# Patient Record
Sex: Female | Born: 1950
Health system: Southern US, Academic
[De-identification: ages and names within clinical notes are randomized; demographics above are authoritative.]

---

## 2001-12-05 ENCOUNTER — Ambulatory Visit (HOSPITAL_COMMUNITY): Payer: Self-pay

## 2014-05-06 ENCOUNTER — Ambulatory Visit (INDEPENDENT_AMBULATORY_CARE_PROVIDER_SITE_OTHER): Payer: Self-pay | Admitting: Neurological Surgery

## 2016-08-06 ENCOUNTER — Ambulatory Visit (HOSPITAL_BASED_OUTPATIENT_CLINIC_OR_DEPARTMENT_OTHER): Payer: Self-pay

## 2016-08-06 DIAGNOSIS — Z006 Encounter for examination for normal comparison and control in clinical research program: Secondary | ICD-10-CM

## 2016-08-18 NOTE — Progress Notes (Signed)
[name] showed for Finding WEllness. Laurie LunaGwen Sarai January, MD

## 2020-01-24 IMAGING — MR MRI LSPINE WO CONTRAST
5 of 6 series · 25 of 48 positions shown · non-contrast
Comparison: None

HISTORY: Low back pain down both legs, hx of surgery, no known injury
TECHNIQUE: Routine multiplanar MRI of the lumbar spine was performed without IV contrast.

[Series 16: t2_sag · sagittal · 4.0mm · 0.73mm/px · 4 of 18 slices shown]
[im 1/18]
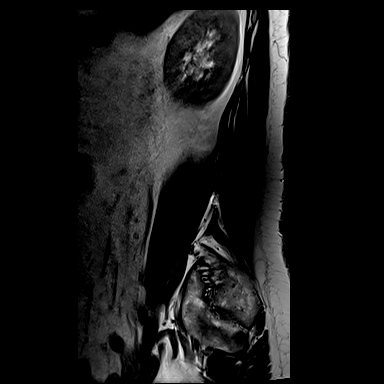
[im 6/18]
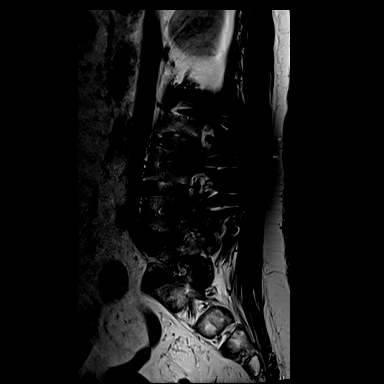
[im 12/18]
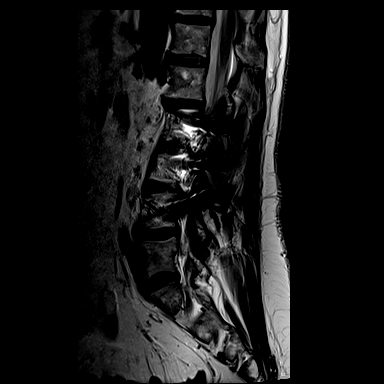
[im 18/18]
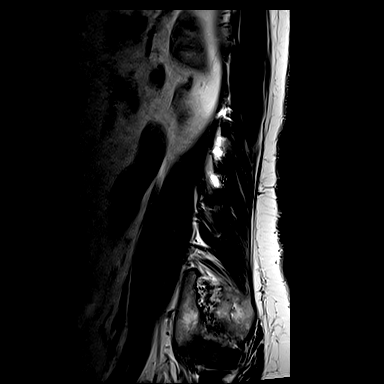

[Series 17: t1_sag · sagittal · 4.0mm · 0.88mm/px · 4 of 19 slices shown]
[im 1/19]
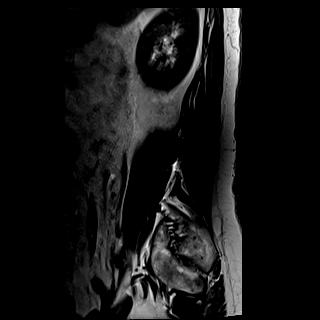
[im 7/19]
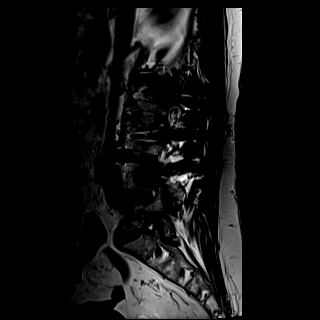
[im 13/19]
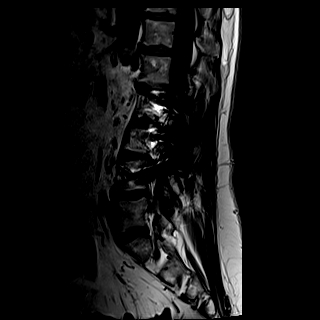
[im 19/19]
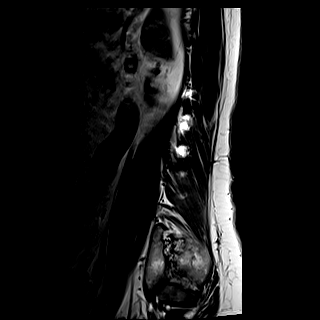

[Series 18: stir_sag · sagittal · 4.0mm · 1.09mm/px · 4 of 19 slices shown]
[im 1/19]
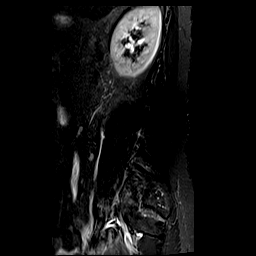
[im 7/19]
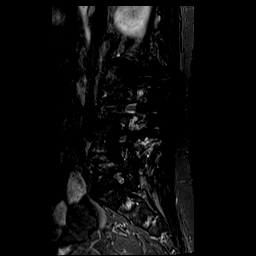
[im 13/19]
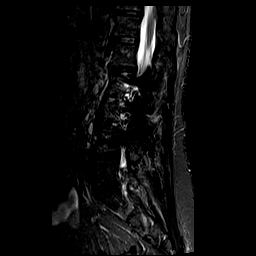
[im 19/19]
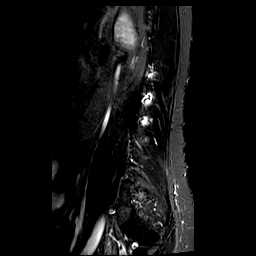

[Series 19: t2_axial · axial · 4.0mm · 0.52mm/px · z∈[-482,-285]mm · 8 of 41 slices shown]
[im 1/41]
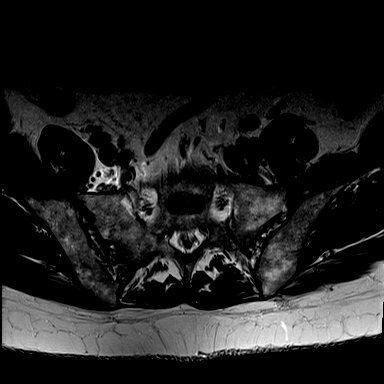
[im 6/41]
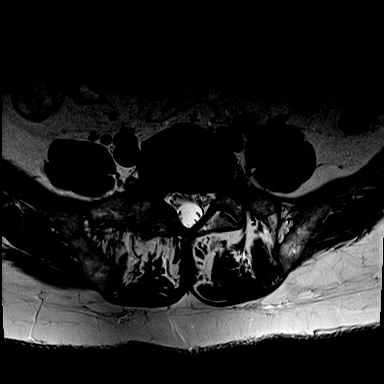
[im 12/41]
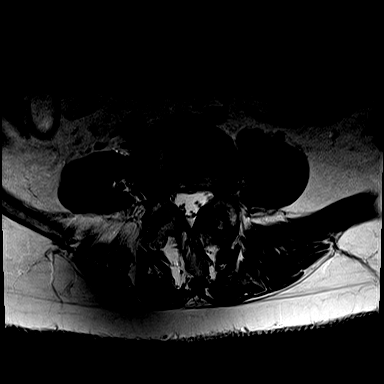
[im 18/41]
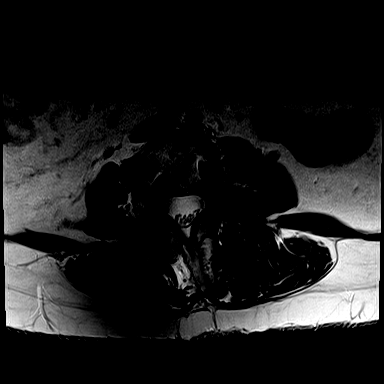
[im 23/41]
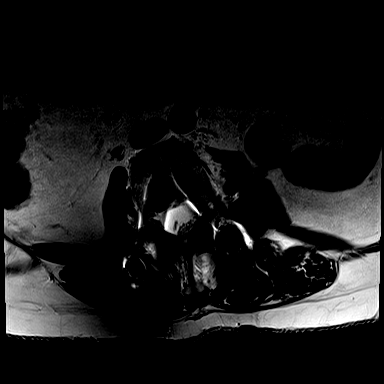
[im 29/41]
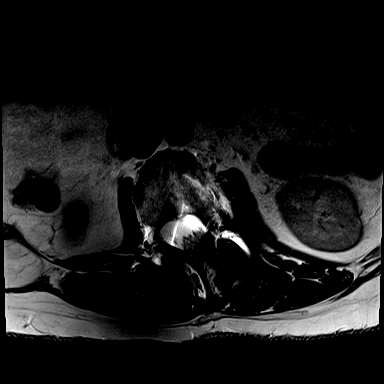
[im 35/41]
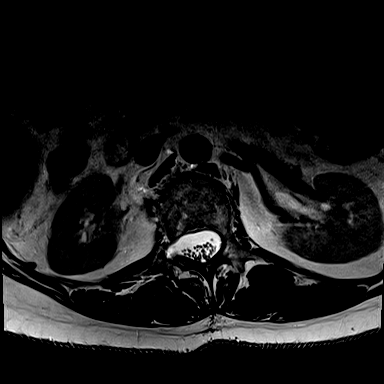
[im 41/41]
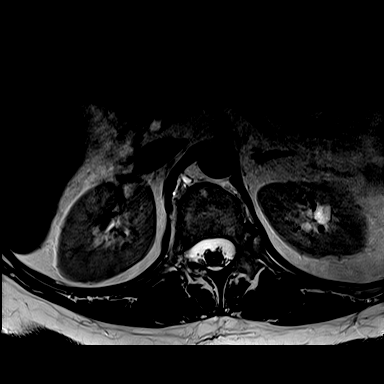

[Series 20: t1_axial_obli · axial · 3.0mm · 0.78mm/px · z∈[-492,-301]mm · 5 of 26 slices shown]
[im 1/26]
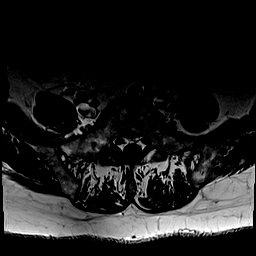
[im 7/26]
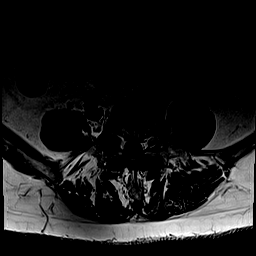
[im 13/26]
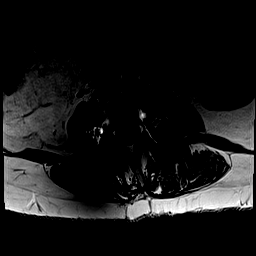
[im 19/26]
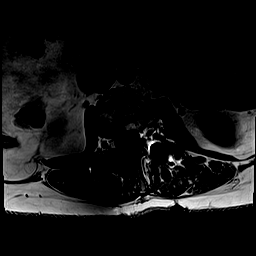
[im 26/26]
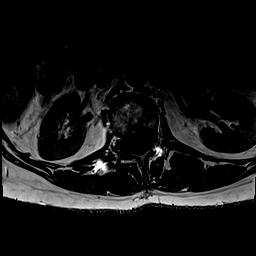

[25 of 48 positions shown; findings below may reference images not displayed]

FINDINGS: Lumbar segments demonstrate mild lumbar dextroscoliosis. No anterolisthesis or retrolisthesis.

Patient status post discectomy and fusion at L2-3 and L3-4. Orthopedic hardware produces significant surrounding susceptibility artifact.

Mild diffuse disc bulging at remaining disc space levels. No abnormal signal in an intradural or paraspinous position. No postop fluid collections are visible.

T12-L1, L1-2: Mild diffuse disc bulging. Mild facet arthropathy. No significant degree of canal or foraminal stenosis.

L2-3: Postop discectomy and fusion. No canal stenosis. Bilateral facet arthropathy. No convincing evidence of significant foraminal stenosis. Foramina are obscured by susceptibility artifact.

L3-4: Postop discectomy and fusion with pedicle screws and connecting rods. There is no canal stenosis. No evidence of significant foraminal stenosis. Foramina are partially obscured by susceptibility artifact.

L4-5: Moderate diffuse disc bulge, more prominent to the right of midline. Moderate facet arthropathy. Mild canal stenosis. Moderate right foraminal stenosis. Mild left foraminal stenosis.

L5-S1: Mild diffuse disc bulge. Mild facet arthropathy, more prominent on the right. No canal stenosis. Moderate right foraminal stenosis. Mild left foraminal stenosis.
IMPRESSION: Lumbar dextroscoliosis with associated multilevel degenerative disc disease and facet arthropathy most severe at L4-5 where there is a mild degree of canal stenosis and moderate right foraminal stenosis. Moderate right foraminal stenosis also present L5-S1. Postop discectomy and fusion from L2 to L4. No stenoses at postop levels. No postop fluid collections.

## 2020-02-23 IMAGING — MR MRI KNEE RT WO CONTRAST
4 of 5 series · 30 of 40 positions shown · IV contrast (gadolinium)
Comparison: None

HISTORY: Pain in right knee
TECHNIQUE: Multiplanar and multisequence MR imaging of the right knee was performed without the administration of intravenous gadolinium.

[Series 8: t2_axial_fs · axial · right · 3.0mm · 0.45mm/px · z∈[-53,+61]mm · 8 of 30 slices shown]
[im 1/30]
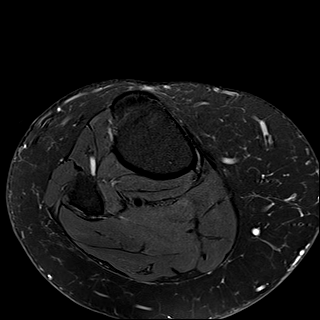
[im 5/30]
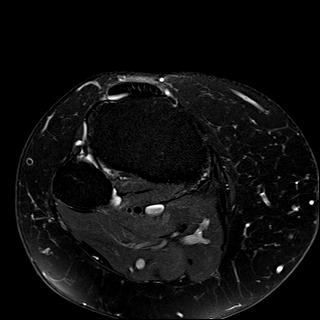
[im 9/30]
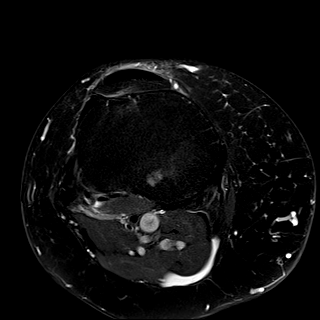
[im 13/30]
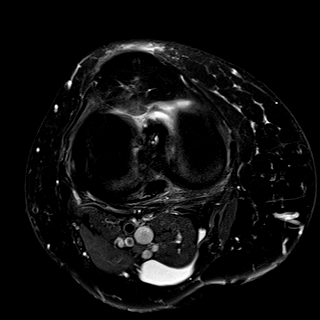
[im 17/30]
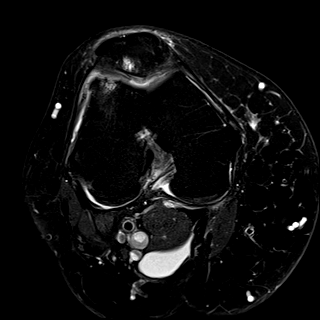
[im 21/30]
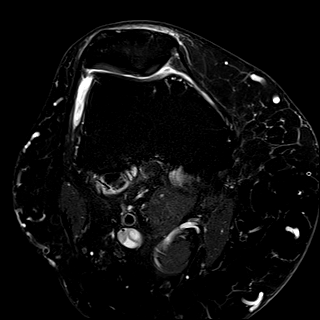
[im 25/30]
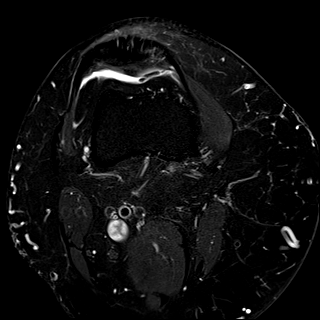
[im 30/30]
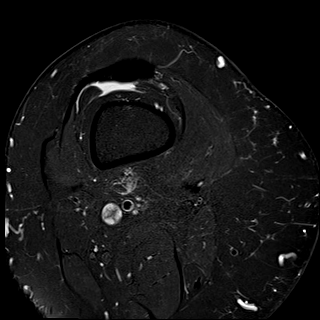

[Series 10: t1_cor · coronal · right · 3.5mm · 0.36mm/px · 9 of 28 slices shown]
[im 1/28]
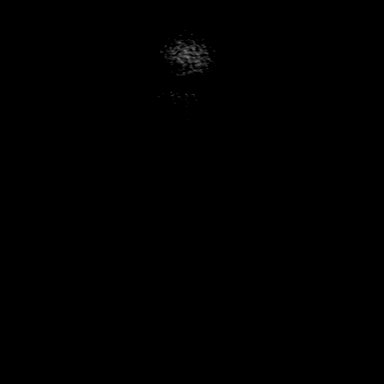
[im 4/28]
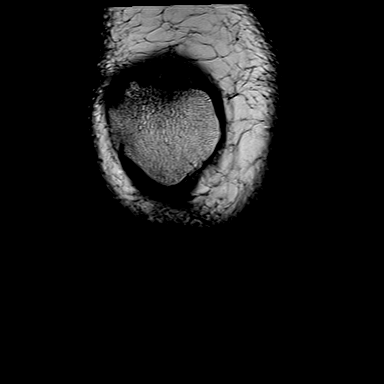
[im 7/28]
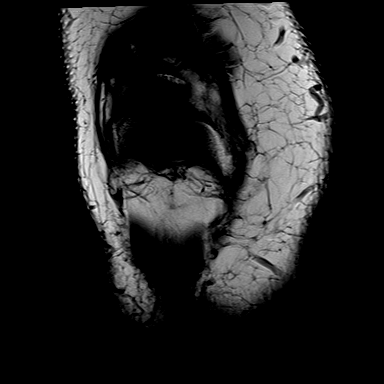
[im 11/28]
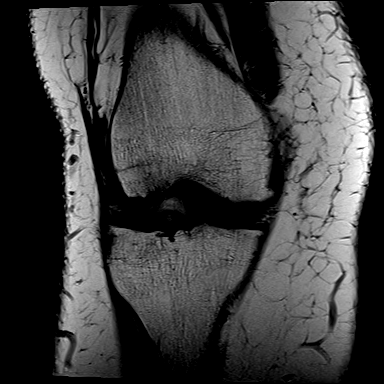
[im 14/28]
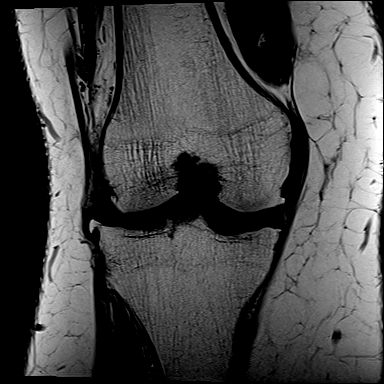
[im 17/28]
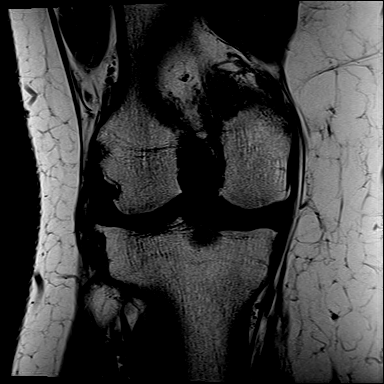
[im 21/28]
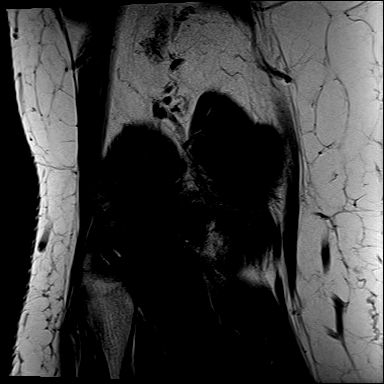
[im 24/28]
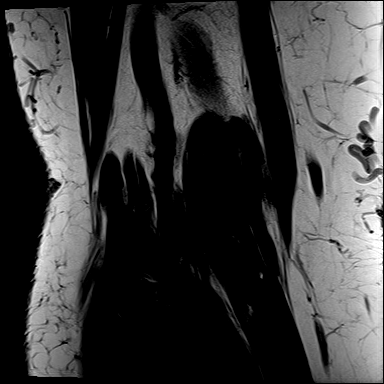
[im 28/28]
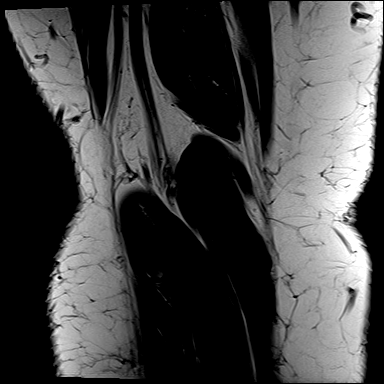

[Series 11: t2_cor_fs · coronal · right · 3.5mm · 0.44mm/px · 9 of 28 slices shown]
[im 1/28]
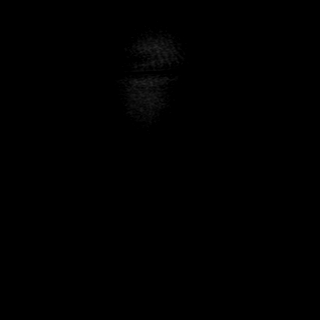
[im 4/28]
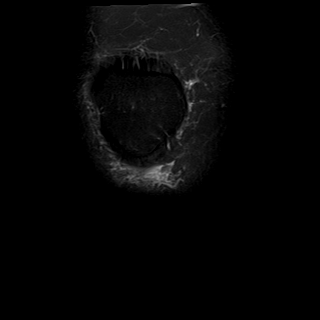
[im 7/28]
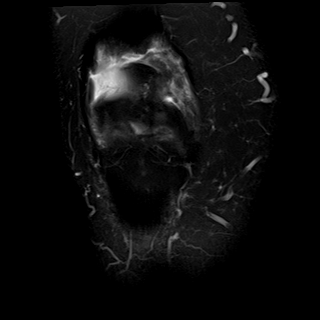
[im 11/28]
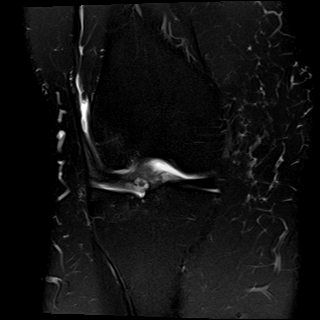
[im 14/28]
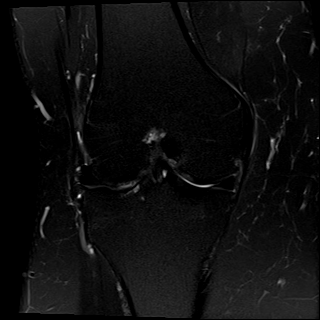
[im 17/28]
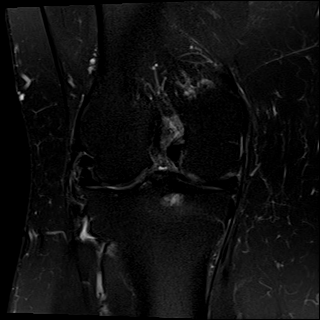
[im 21/28]
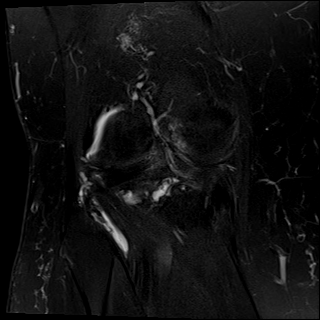
[im 24/28]
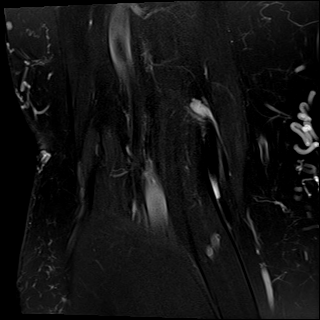
[im 28/28]
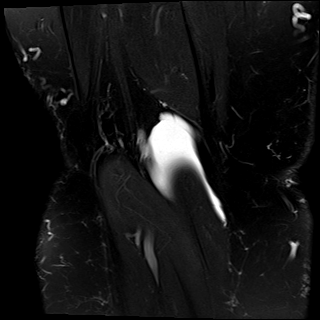

[Series 1012: pd_sag_fs · sagittal · right · 3.0mm · 0.23mm/px · 4 of 22 slices shown]
[im 1/22]
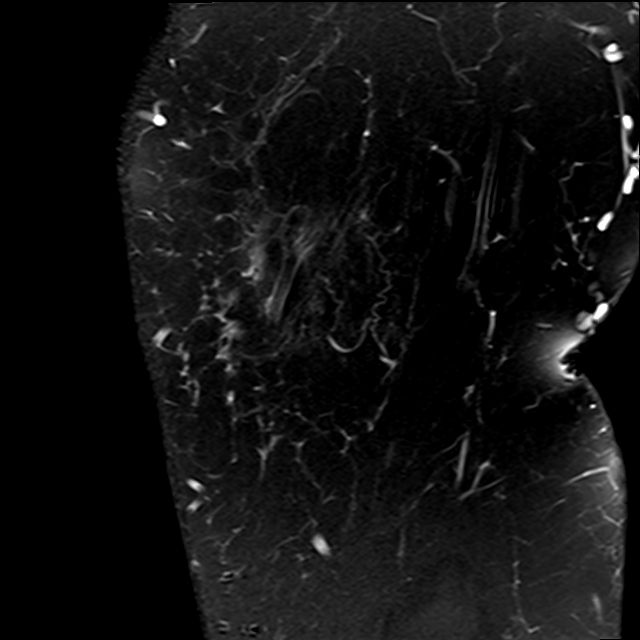
[im 4/22]
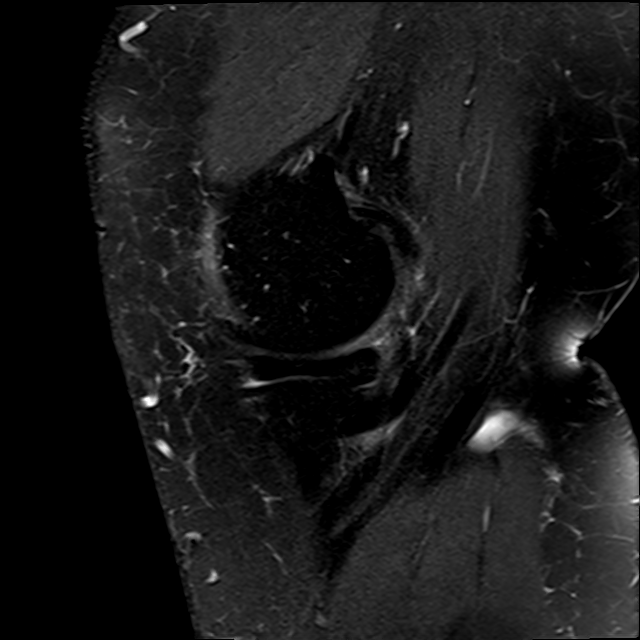
[im 11/22]
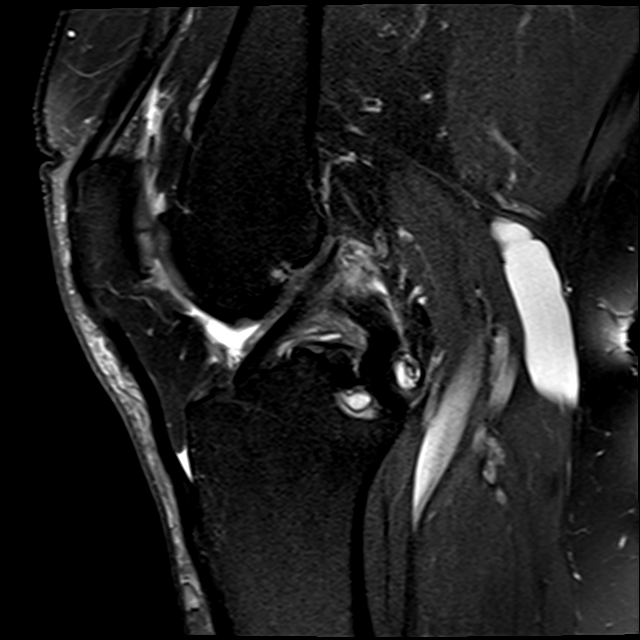
[im 18/22]
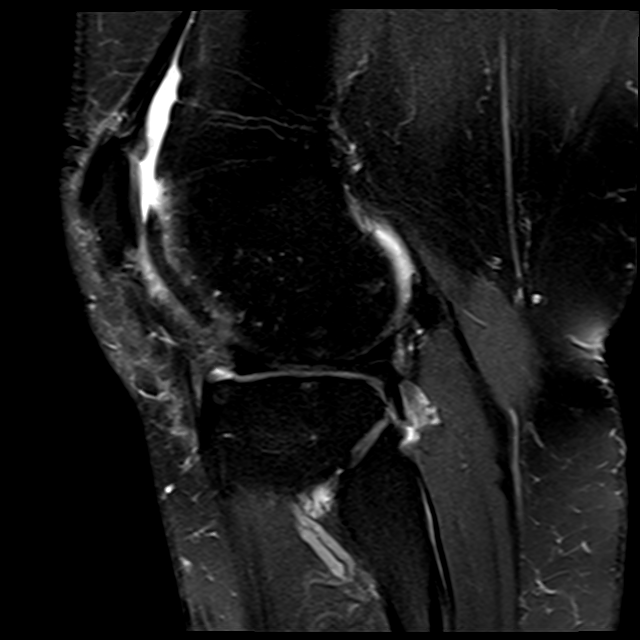

[30 of 40 positions shown; findings below may reference images not displayed]

FINDINGS: The anterior cruciate, posterior cruciate, medial collateral and lateral collateral ligaments are intact.

Small oblique undersurface tear posterior horn medial meniscus. A horizontal tear involving the posterior horn and body of the lateral meniscus with minimal extension into the anterior horn.

Small effusion.

Moderate chondral surface irregularity lateral compartment and patellofemoral compartment with associated reactive subcortical marrow edema. Mild chondral surface irregularity medial compartment. Small marginal osteophytes in several positions.

Osseous structures demonstrate no fractures or destructive lesions. Insertional geode at the tibial attachment of the PCL.

Extensor mechanism intact. Minimal patellar tendinosis. Patellar retinacula are unremarkable. Signal from muscle normal. Muscle mass normal. Moderate-sized Baker's cyst. No solid lesions. No visible adenopathy.

Intra-articular fat pads are normal.
IMPRESSION: 1. Medial and lateral meniscus tears as described above.

2. Moderate chondral surface irregularity lateral compartment and patellofemoral compartment with associated reactive subcortical marrow edema.

3. Small effusion.

4. Intact cruciate and collateral ligaments.

## 2021-01-24 IMAGING — OT DXA BONE DENSITY
3 series · 3 of 3 positions shown · non-contrast
Comparison: none

REASON FOR EXAM: Evaluate bone density.

RISK FACTORS:  None.
PRIOR EXAMS:  None.
METHOD:  Scans of the spine, hip, and forearm were performed using dual energy X-ray densitometry (DXA) with the [employer] Discovery SL ([id number])  system.

[Series 1: — · left · 1 of 1 slices shown (1 of 3)]
[im 1/1]
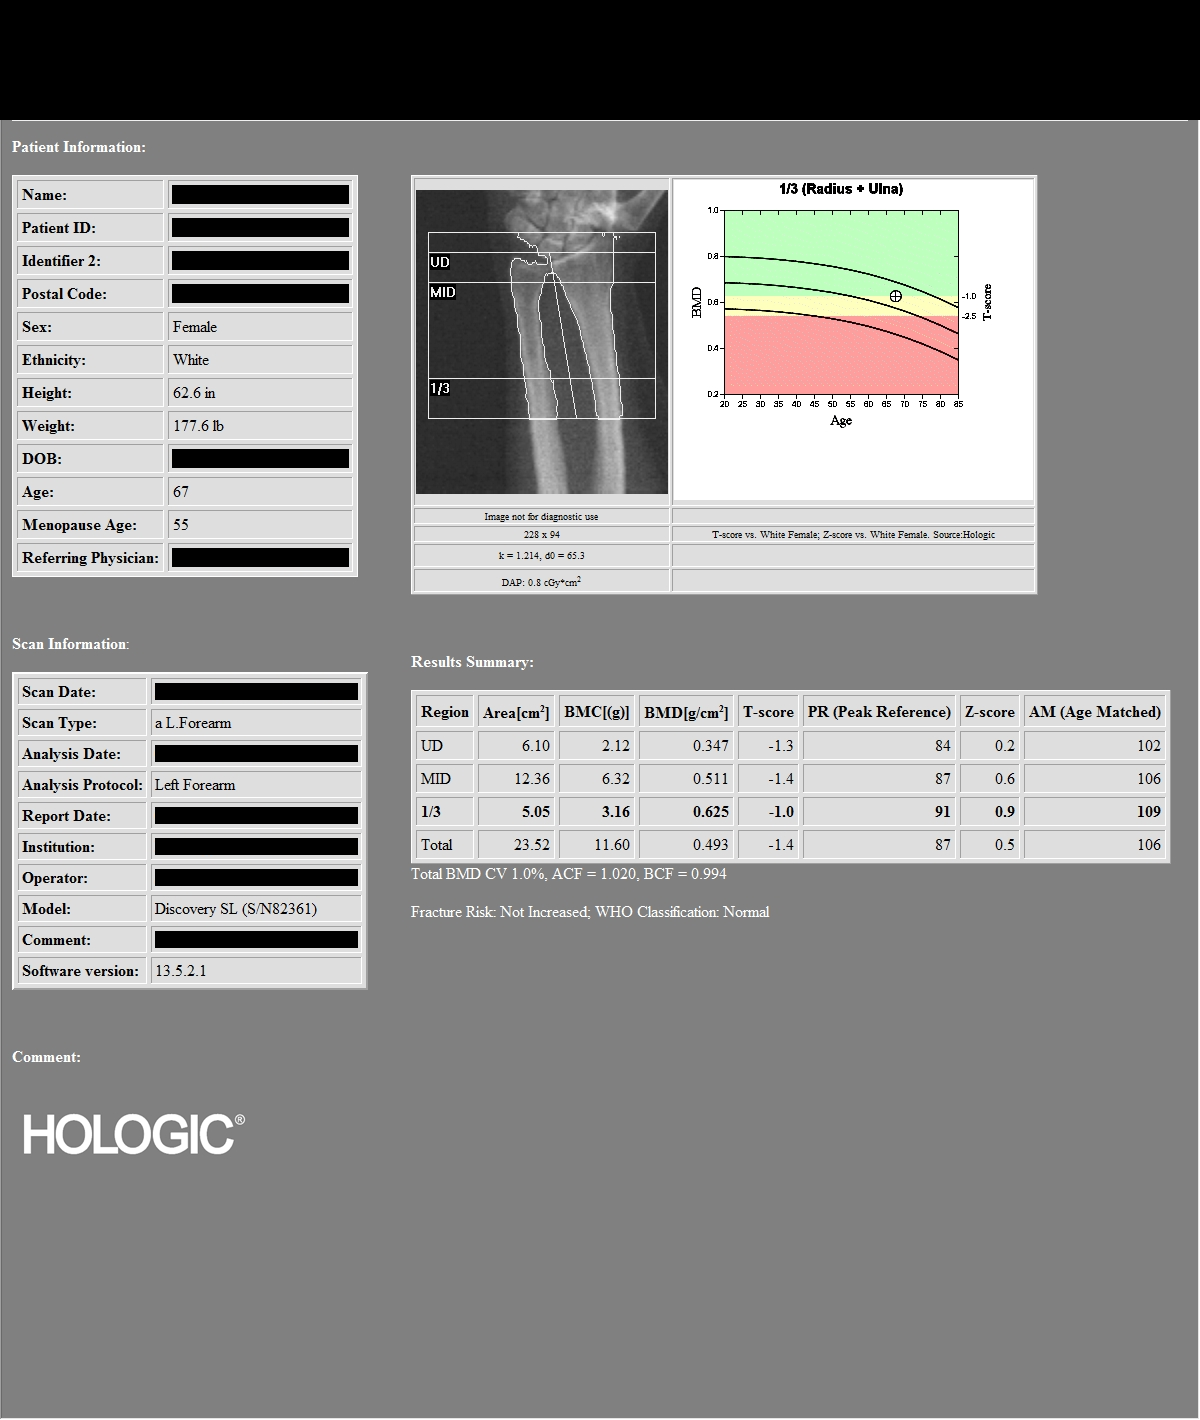

[Series 2: — · 1 of 1 slices shown (2 of 3)]
[im 1/1]
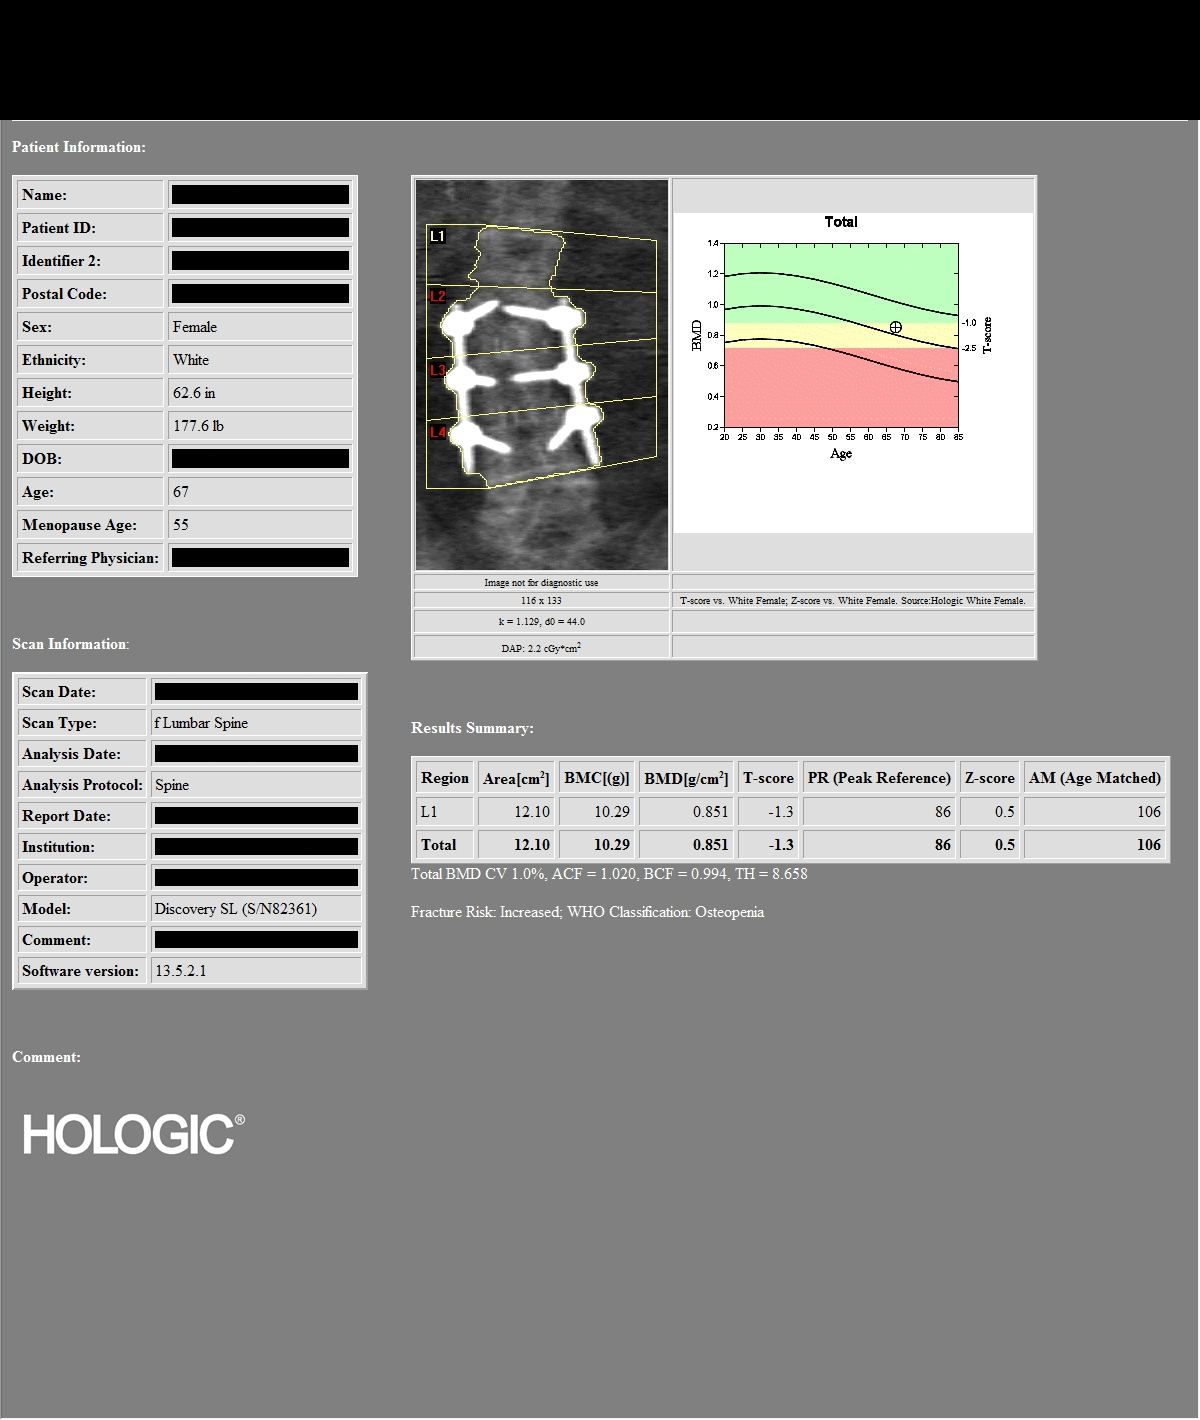

[Series 3: — · left · 1 of 1 slices shown (3 of 3)]
[im 1/1]
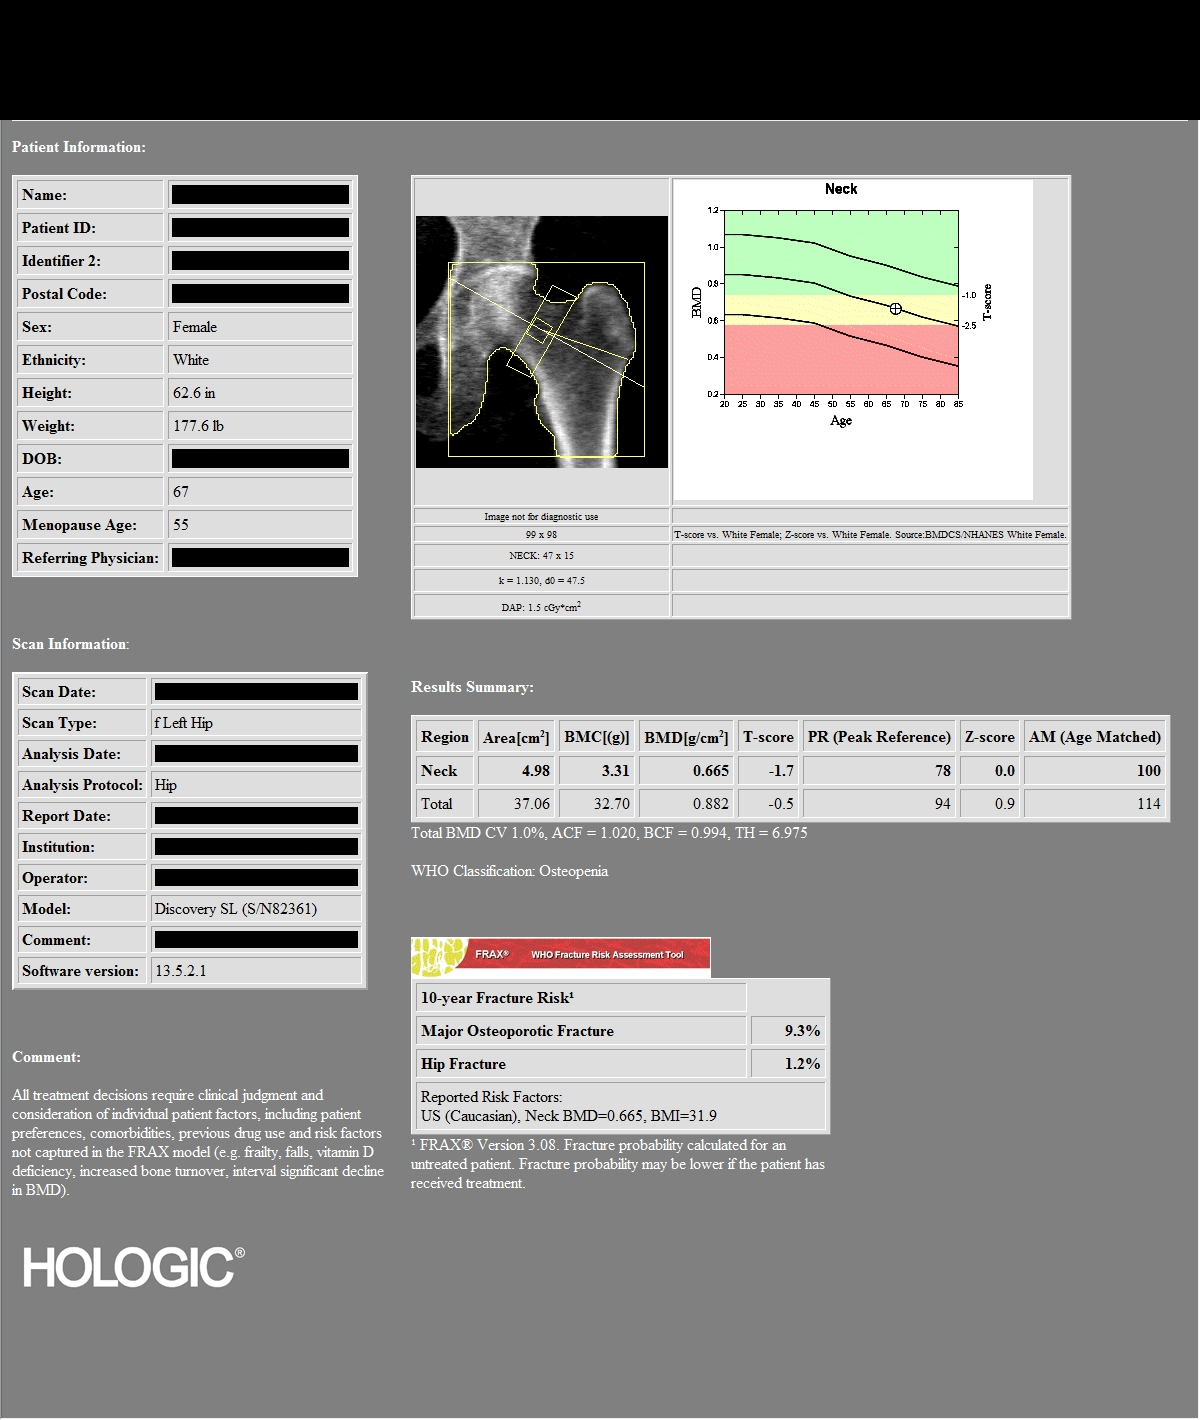

[3 of 3 positions shown; findings below may reference images not displayed]

IMPRESSION: As defined by World Health Organization, the patient meets the criteria for OSTEOPENIA based on left hip T-scores.

PATIENT DEMOGRAPHICS:  69-year-old white female.
FINDINGS: 1.    Review of scanogram images shows pedicle screws and posterior bars from L2 down to L4, which made the spine nondiagnostic since only one vertebra can be used. For this reason, the distal left third radius was evaluated.

2.    The lumbar spine exam using L1 regions shows average Bone Mineral Density is 0.851 gm/cm2 of Hydroxyapatite.  The T-score (comparing patient with a young adult group) is 1.3 standard deviations BELOW mean. The Z-score (comparing patient with an age-matched group) is 0.5 standard deviations ABOVE mean.

3.  The left hip exam using femoral neck region of interest shows average Bone Mineral Density is 0.665 gm/cm2 of Hydroxyapatite. The T-score (comparing patient with a young adult group) is 1.7 standard deviations BELOW mean. The Z-score (comparing patient with an age-matched group) is 0.0 standard deviations AT mean.

4.   The left forearm exam using [DATE] radius region of interest shows average Bone Mineral Density is 0.625 gm/cm2 of Hydroxyapatite. The T-score (comparing patient with a young adult group) is 1.0 standard deviations BELOW mean. The Z-score (comparing patient with an age-matched group) is 0.9 standard deviations ABOVE mean.

According to the World Health Organization risk assessment tool (FRAX) for osteopenia only, which uses the femoral neck T score and includes other patient risk factors for fracture, the patient has a 10-year absolute risk of hip fracture of 1.2% and 10-year absolute risk fracture for any major fracture of 9.3%.

RECOMMENDATIONS:  The patient states that she is not taking supplements on a regular basis.  The patient should continue being a nonsmoker and regular exercise to patient tolerance would be of benefit.  The patient is currently not taking prescribed medication for prevention of bone loss.  According to criteria established by the National Osteoporosis Foundation, the patient DOES NOT meet the current indications for prescribed medical therapy.  The National Osteoporosis Foundation now recommends followup DXA scanning every two years in patients at risk regardless of whether the patient is undergoing pharmacological treatment.

World Health Organization criteria for diagnosis, please see link below.

[URL]

## 2021-01-24 IMAGING — US US NON OB TRANSVAGINAL W LTD TA
1 series · 14 of 28 positions shown · non-contrast
Comparison: None.

HISTORY: Abnormal bleeding
TECHNIQUE: Transvaginal and limited transabdominal sonography of the pelvis was performed.  Sagittal and transverse images were obtained.

[Series 1: us non ob transvaginal w ltd ta · 14 of 44 slices shown]
[im 2/44]
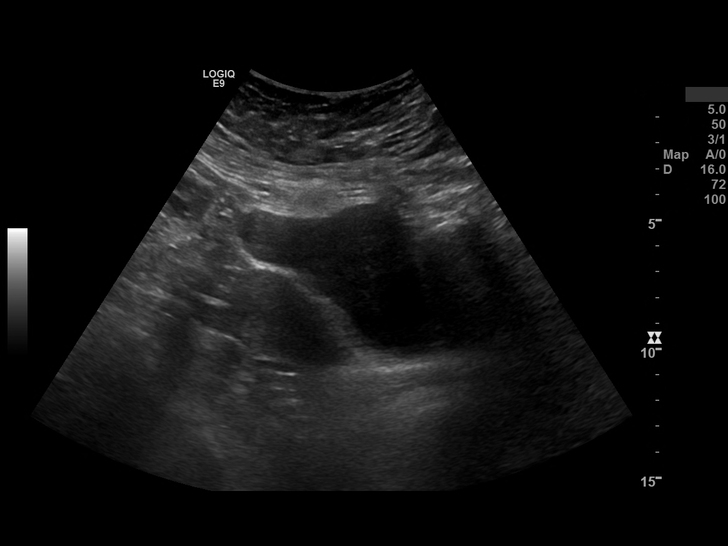
[im 5/44]
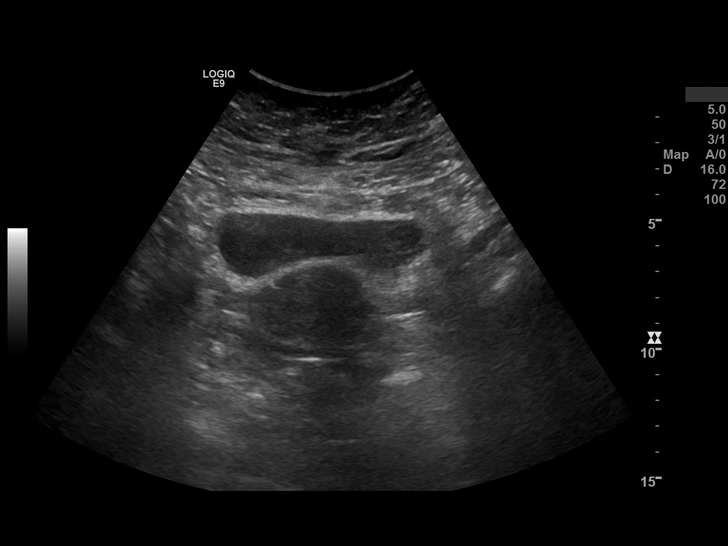
[im 8/44]
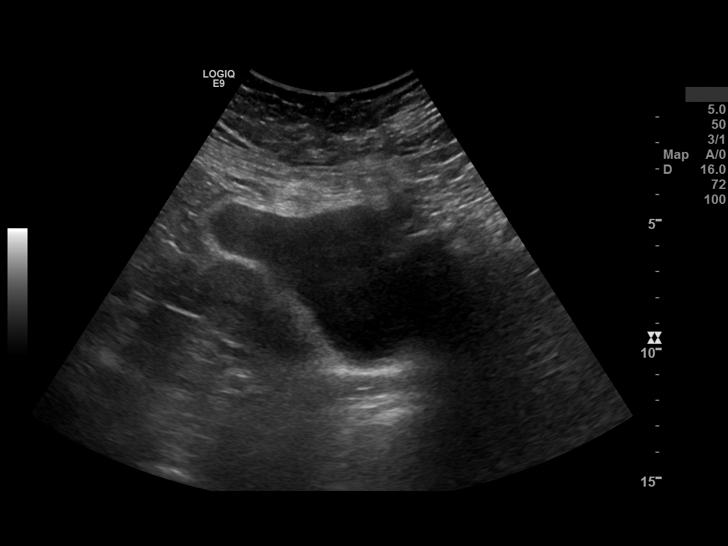
[im 12/44]
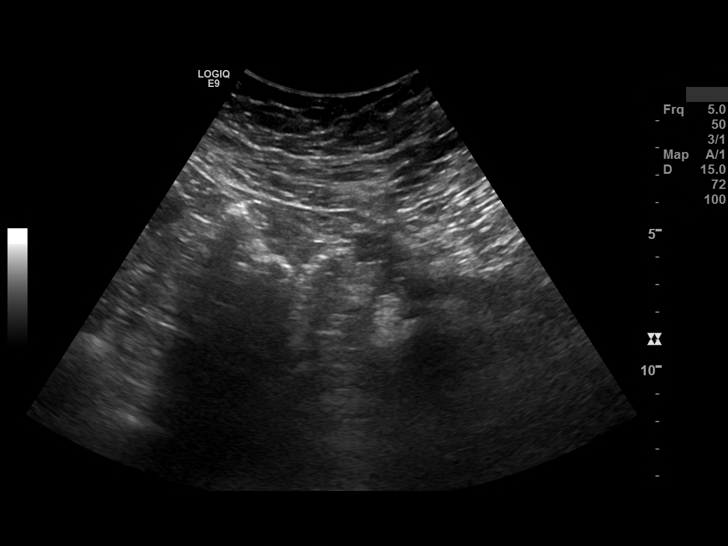
[im 15/44]
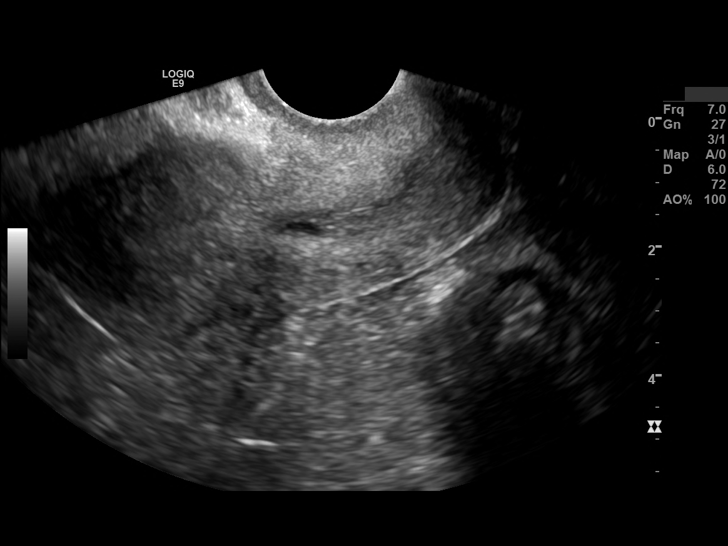
[im 18/44]
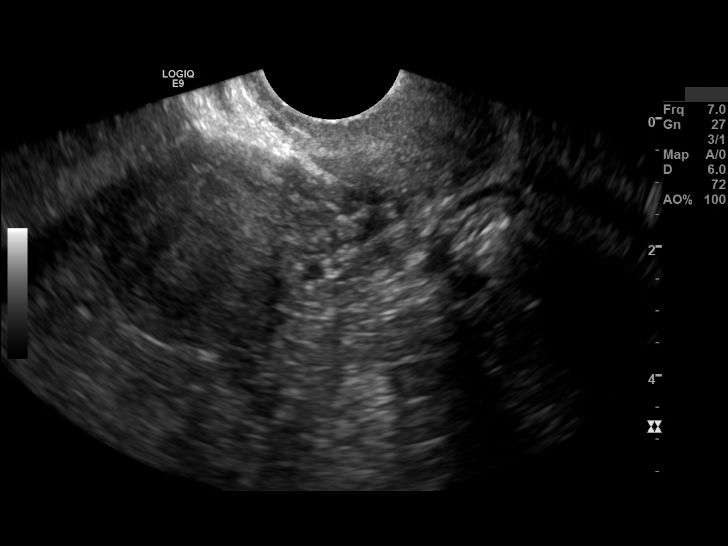
[im 21/44]
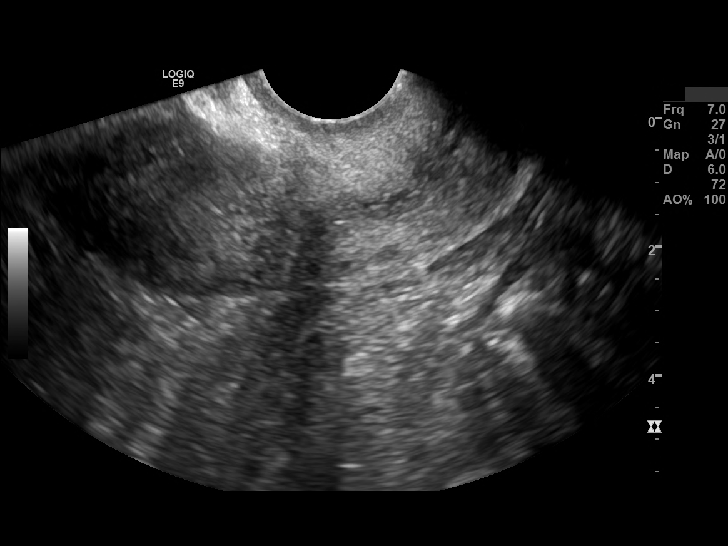
[im 24/44]
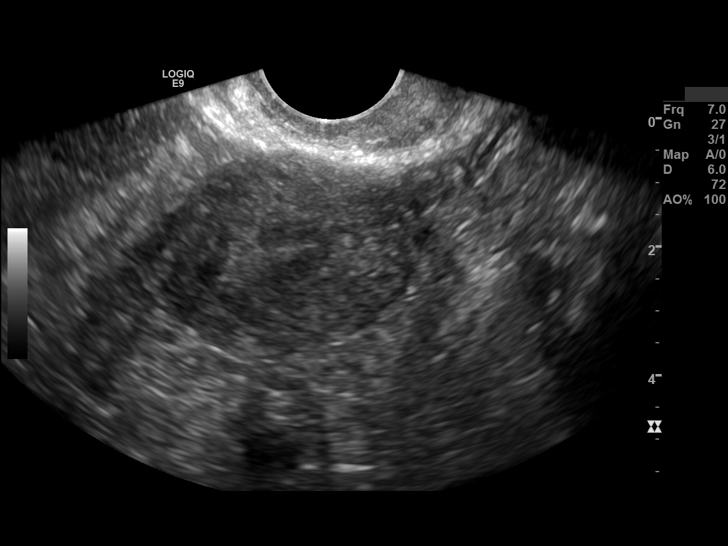
[im 28/44]
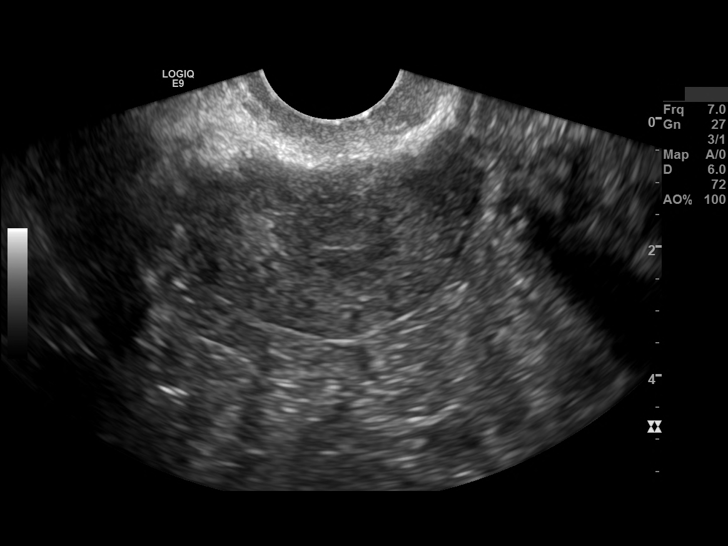
[im 31/44]
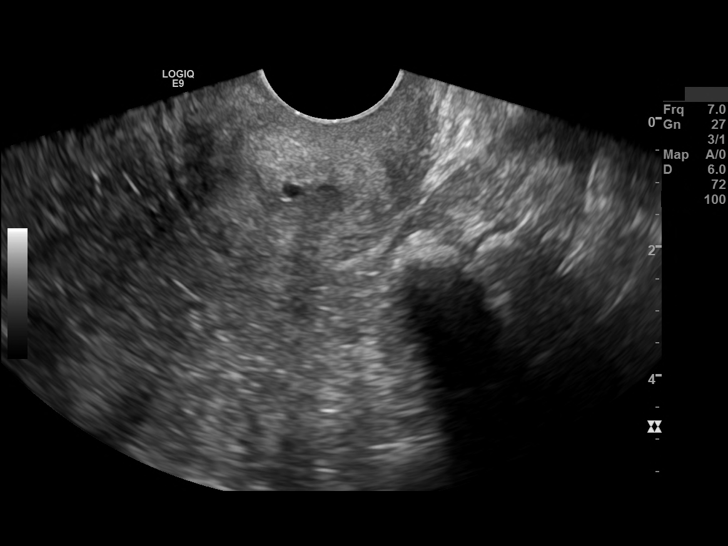
[im 34/44]
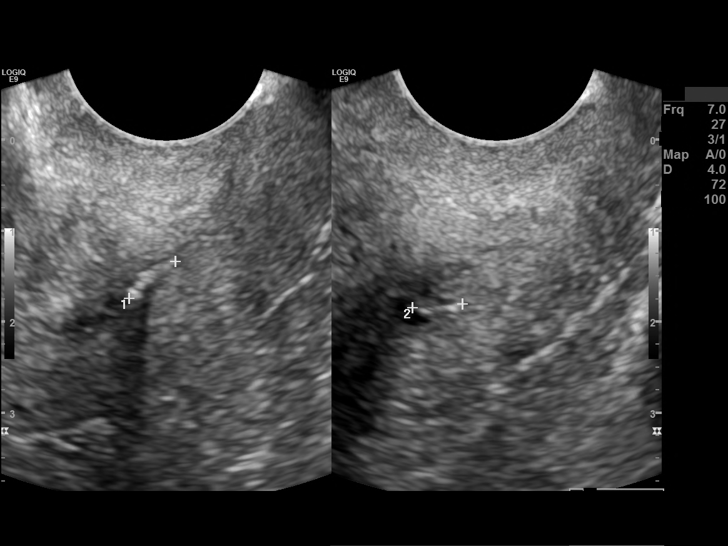
[im 37/44]
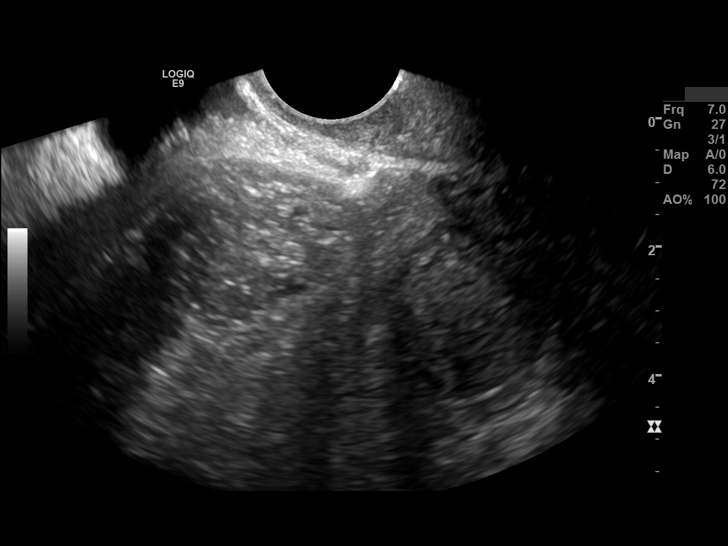
[im 40/44]
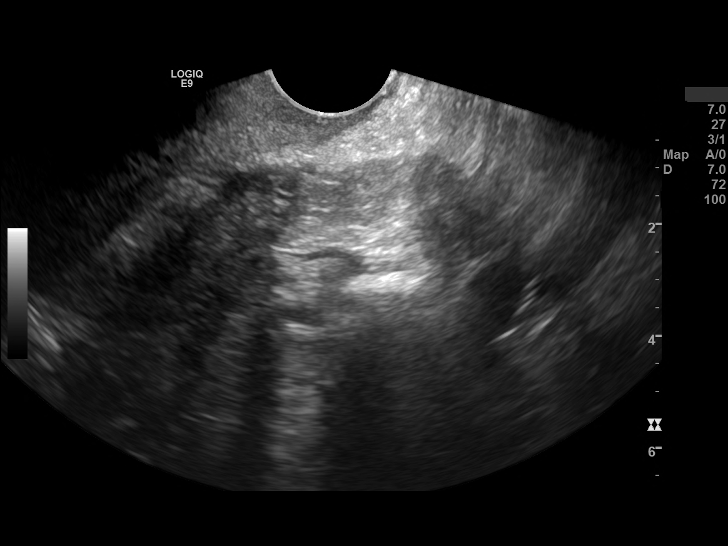
[im 44/44]
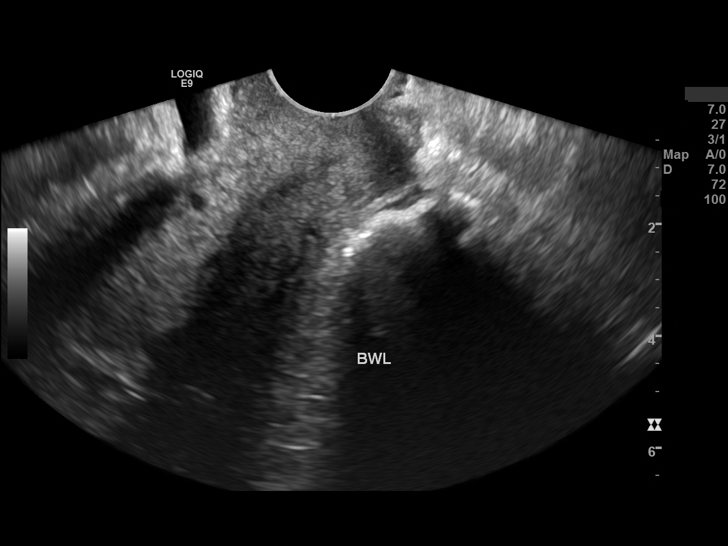

[14 of 28 positions shown; findings below may reference images not displayed]

FINDINGS: UTERUS:  The uterus is anteverted   .  It measures 8.0 cm in length by 3.5 cm anterior posterior by 4.8 cm in width.

ENDOMETRIUM:  Endometrial thickness is 5 mm. Tiny hyperechoic foci in the region of the endocervical canal are of uncertain significance perhaps small calcifications.

RIGHT OVARY:  The right ovary is not visualized.

LEFT OVARY:  The left ovary measures 0.9 x 0.7 x 0.6 cm.

CUL-DE-SAC:  No free fluid is seen.

OTHER:  None.
IMPRESSION: 1.
Endometrial thickness upper normal at 5 mm.

2.
Normal left ovary. Right ovary not visualized.

3.
Tiny hyperechoic foci in the region of the endocervical canal are of uncertain significance.

## 2021-09-24 IMAGING — CT CT LSPINE WO CONTRAST
5 of 7 series · 14 of 33 positions shown, 15 images · non-contrast
Comparison: MRI lumbar spine 01/24/20

HISTORY: Lumbar vertebral fracture, per patient hx of surgery 1372, injured 09/16/2021 after hitting back on wall sliding down mattress, pain radiates down right side
TECHNIQUE: CT images of the lumbar spine were obtained without intravenous contrast administration. Coronal and sagittal reconstructed images are obtained. 

Dose reduction technique used: Automated exposure control and adjustment of the mA and/or kV according to patient size. CT count previous 12 months: 0

[Series 3: soft tissue · axial · 0.30mm/px · z∈[-1368,-1299]mm · 2 of 71 slices shown]
[im 24/71  soft-tissue]
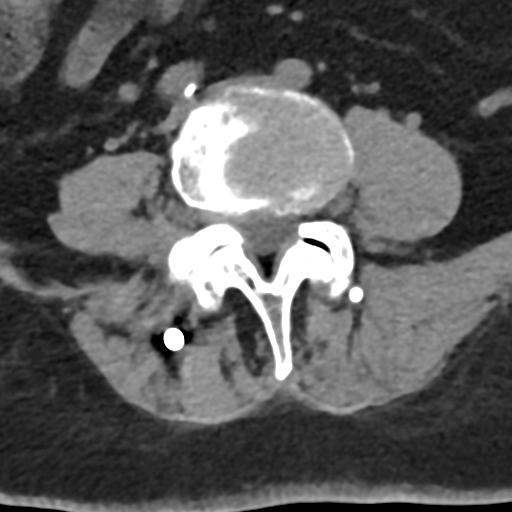
[im 47/71  soft-tissue]
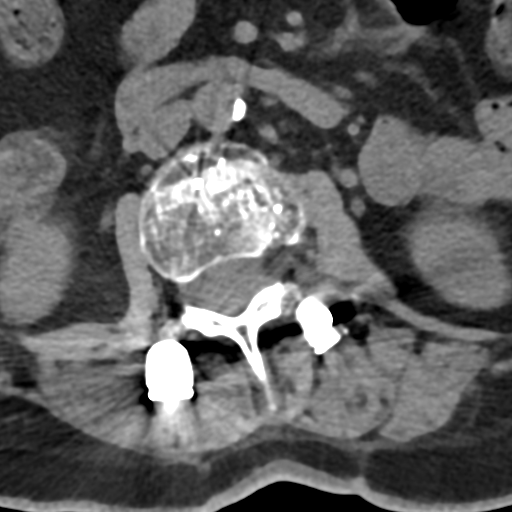

[Series 7: coronal · coronal · 0.30mm/px · 1 of 57 slices shown]
[im 29/57  bone]
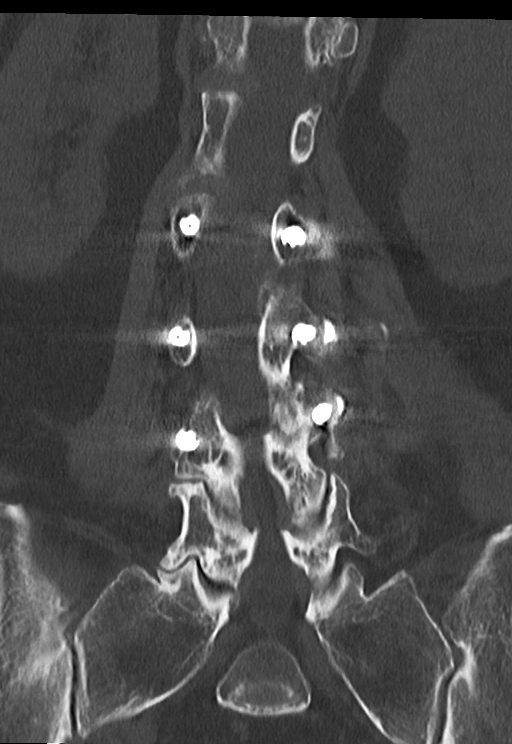

[Series 11: spine ranges · axial · 0.30mm/px · z∈[-1419,-1248]mm · 4 of 112 slices shown, 5 images]
[im 23/112  soft-tissue]
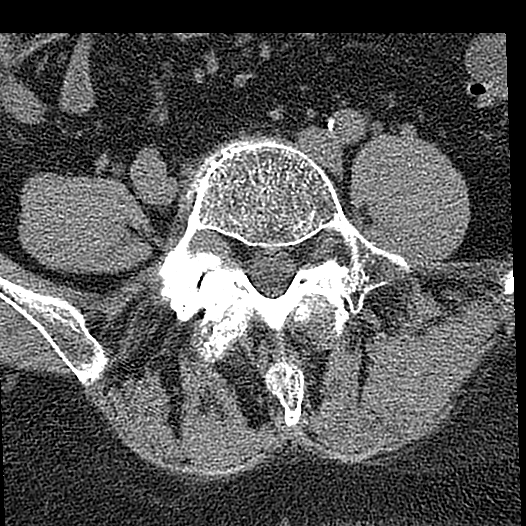
[im 23/112  bone]
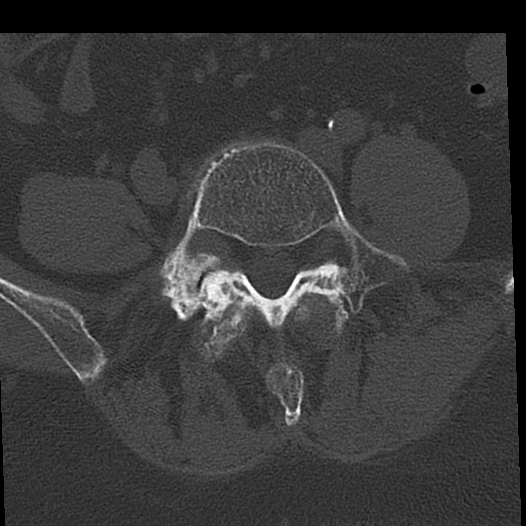
[im 45/112  bone]
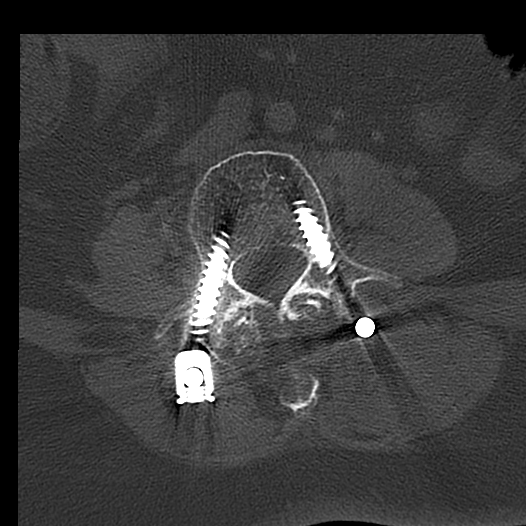
[im 67/112  bone]
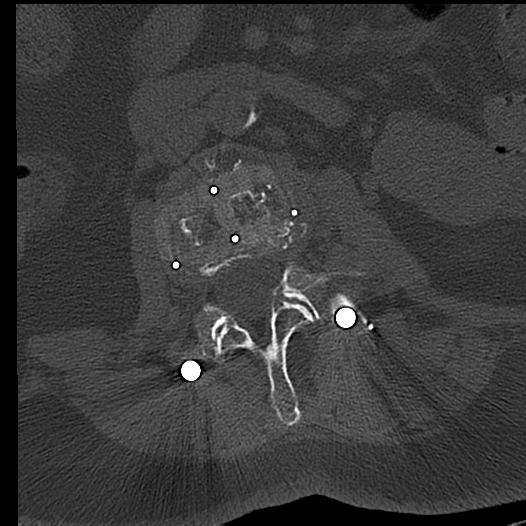
[im 89/112  bone]
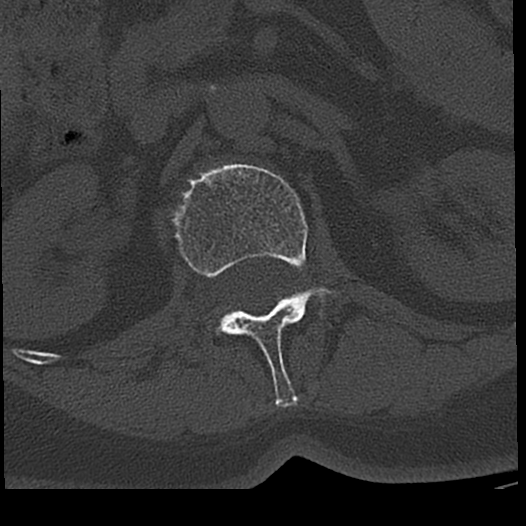

[Series 14: axial (person_name) · axial · 0.30mm/px · z∈[-1368,-1299]mm · 2 of 71 slices shown]
[im 24/71  bone]
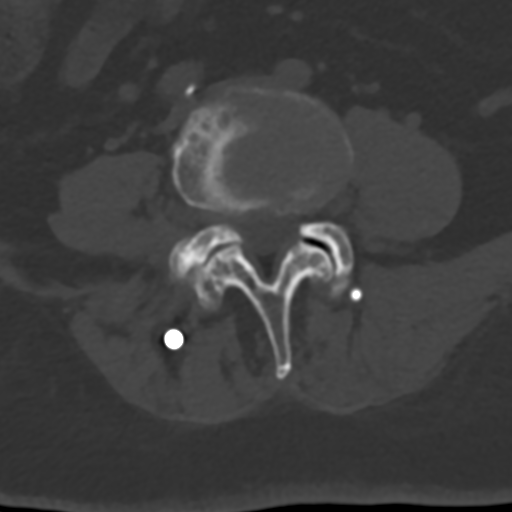
[im 47/71  bone]
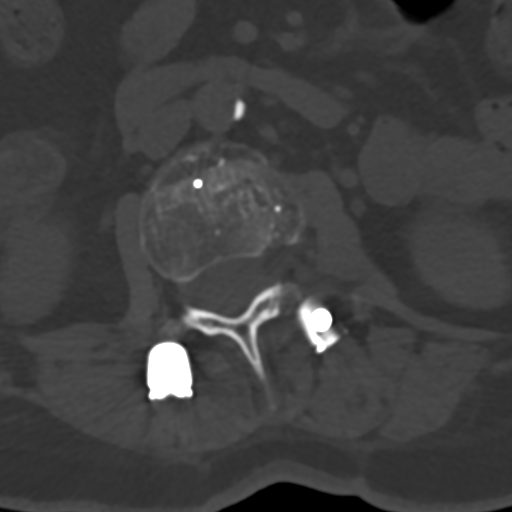

[Series 20: sagittal (person_name) · sagittal · 0.30mm/px · 5 of 72 slices shown]
[im 12/72  bone]
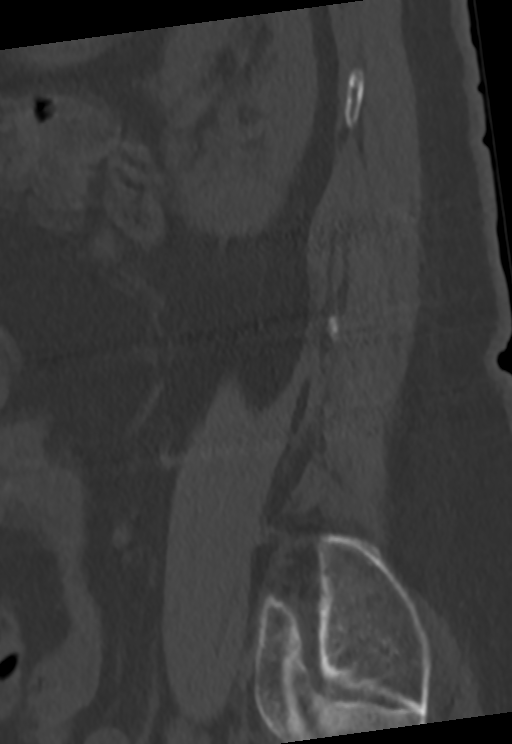
[im 24/72  bone]
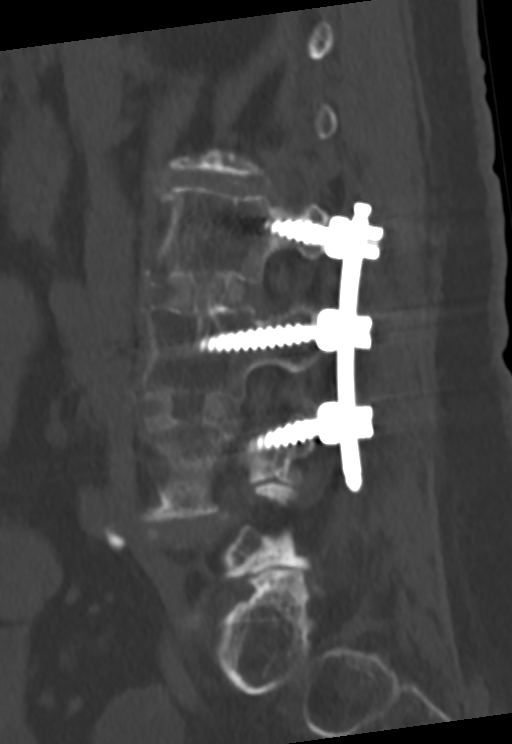
[im 36/72  bone]
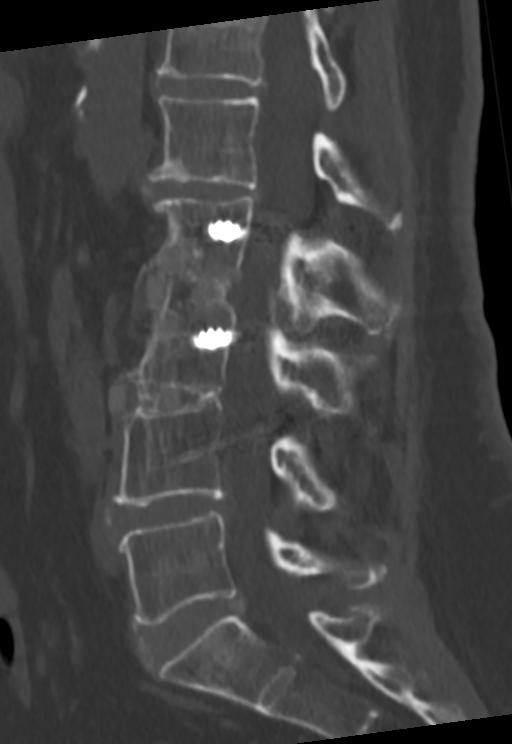
[im 48/72  bone]
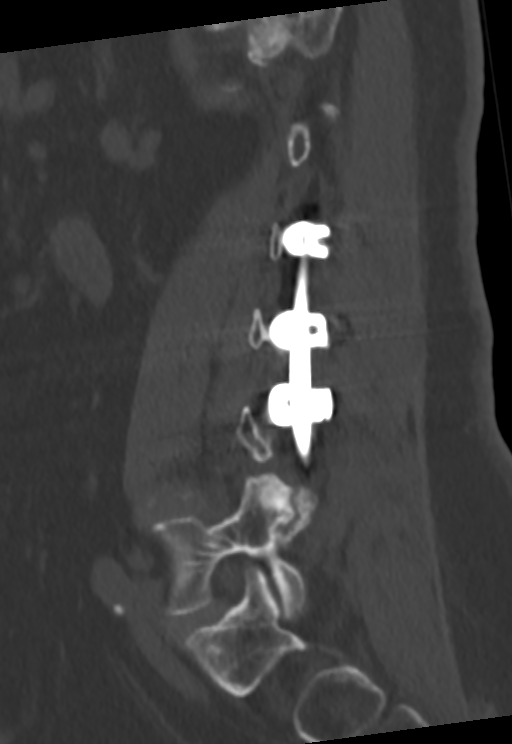
[im 60/72  bone]
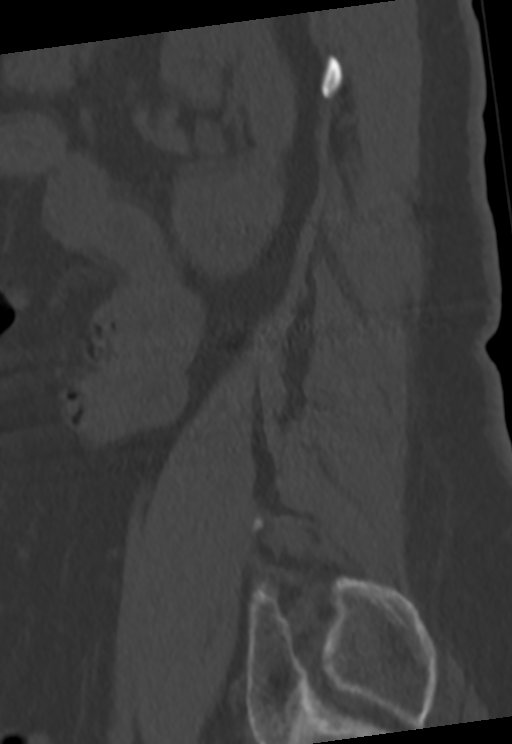

[14 of 33 positions shown; findings below may reference images not displayed]

FINDINGS: Scout: Mild T12 compression deformity.

Spine labeling: Based on assumption of 5 lumbar type nonrib-bearing vertebrae.

Postsurgical changes: Posterior lumbar interbody fusion from L2 to L4.

Alignment: Moderate dextroscoliosis with apex at L3.  

Vertebrae and discs: Diffuse osseous demineralization. No suspicious osseous lesion. 

T12-L1: Small disc bulge. No significant canal or neural foraminal stenosis.

L1-L2: Small disc bulge. Mild facet arthrosis. No significant canal or neural foraminal stenosis.

L2-L3: Postsurgical level. Mild facet arthrosis. No significant canal or neural foraminal stenosis.

L3-L4: Postsurgical level. Mild facet arthrosis. No significant canal or neural foraminal stenosis.

L4-L5: Moderate disc bulge. Mild ligamentum flavum thickening and moderate facet arthrosis. Mild canal and moderate right neural foraminal stenosis.

L5-S1: Mild disc bulge. Moderate right and mild left facet arthrosis. Mild canal and moderate to severe right neural foraminal stenosis.

Mild degenerative changes in the sacroiliac joints. Mild vascular calcifications.
IMPRESSION: 1.
Age-indeterminate mild T12 compression deformity (only seen on the scout images), new since 01/24/20.

[DATE].
No acute abnormality in the lumbar spine. 

3.
Postsurgical changes from L2 to L4 without evidence of hardware complication.

4.
Moderate to severe right L5-S1 neural foraminal stenosis. Possible impingement of the exiting right L5 nerve root.

5.
Moderate right L4-L5 neural foraminal stenosis.

6.
No high-grade canal stenosis.

7.
Diffuse osseous demineralization.

Total radiation dose to patient is CTDIvol 25.60 mGy and DLP 631.00 mGy-cm.

ABNORMAL FINDINGS!

Stat fax

## 2021-09-24 IMAGING — CT CT ABD/PEL WO/W CONT
2 of 5 series · 13 of 46 positions shown, 15 images · non-contrast
Comparison: CT lumbar spine study from same date and lumbar spine MR study 01/24/2020.

HISTORY: Diverticulitis, left lower quadrant pain.
TECHNIQUE: Axial images of the abdomen and pelvis are obtained from the hemidiaphragms to the pubic symphysis prior to and following 100 mL 6sovue-TSS intravenous contrast.  Serum creatinine 0.8 mg/dL. 900 mL Readi-Cat/water oral contrast given. Coronal and sagittal reformation images obtained. Dose reduction technique used: Automated exposure control and adjustment of the mA and/or kV according to patient size.  CT count in previous 12-months: 0

[Series 2: pre contrast · axial · non-contrast · 0.57mm/px · z∈[-1546,-1176]mm · 10 of 88 slices shown, 12 images]
[im 7/88  soft-tissue]
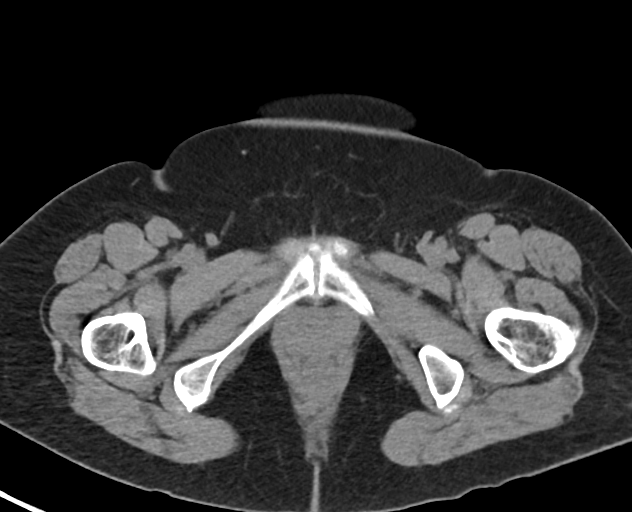
[im 7/88  bone]
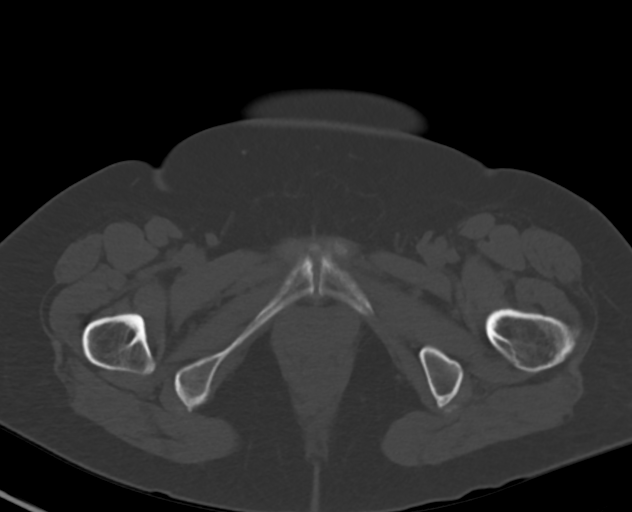
[im 13/88  soft-tissue]
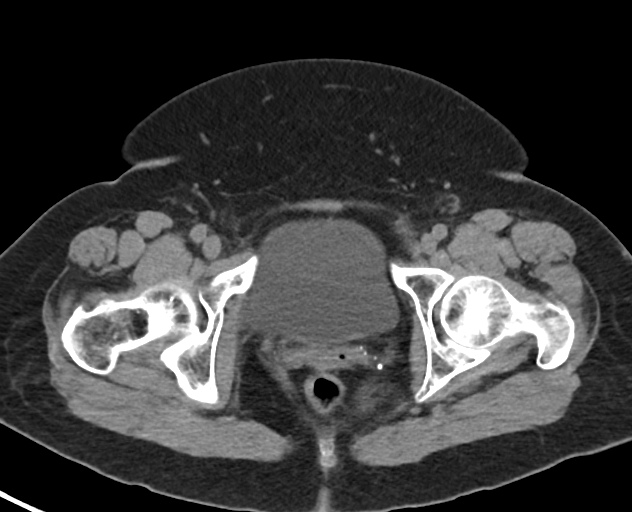
[im 25/88  soft-tissue]
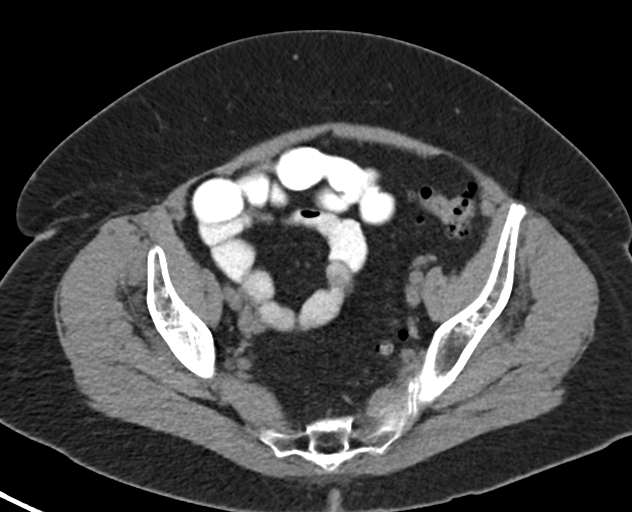
[im 32/88  soft-tissue]
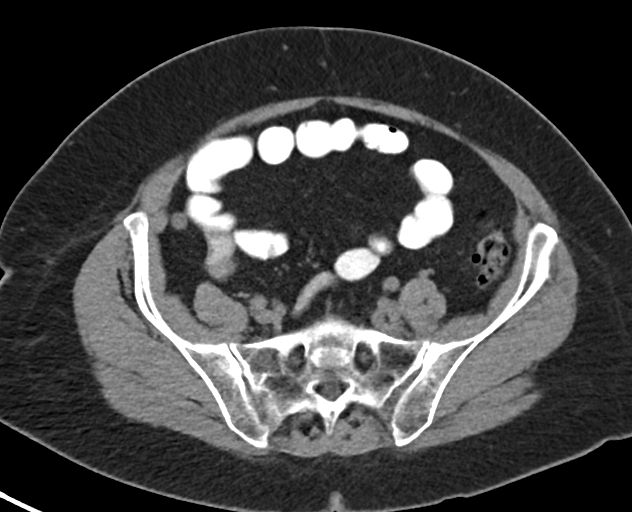
[im 38/88  soft-tissue]
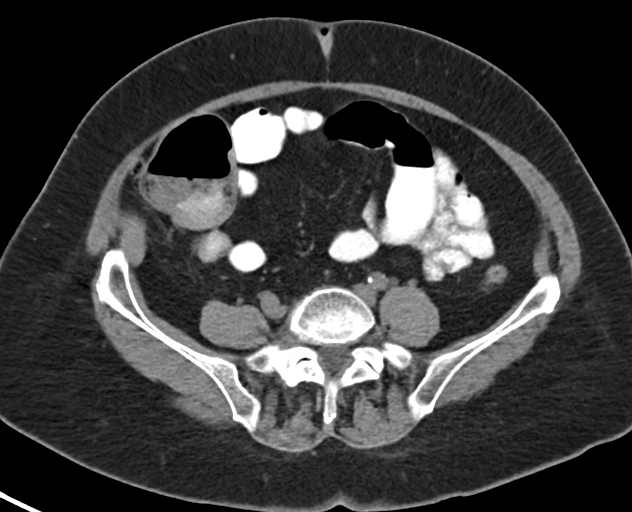
[im 50/88  soft-tissue]
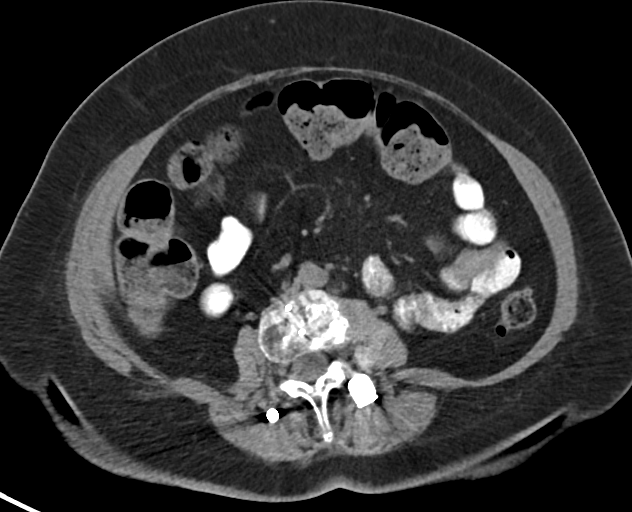
[im 56/88  soft-tissue]
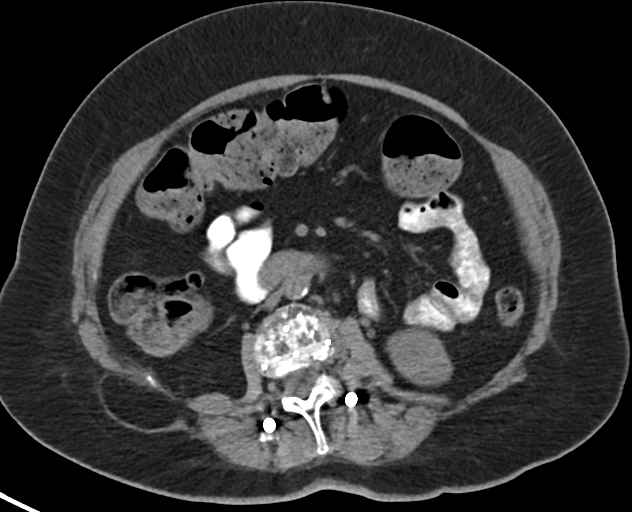
[im 63/88  soft-tissue]
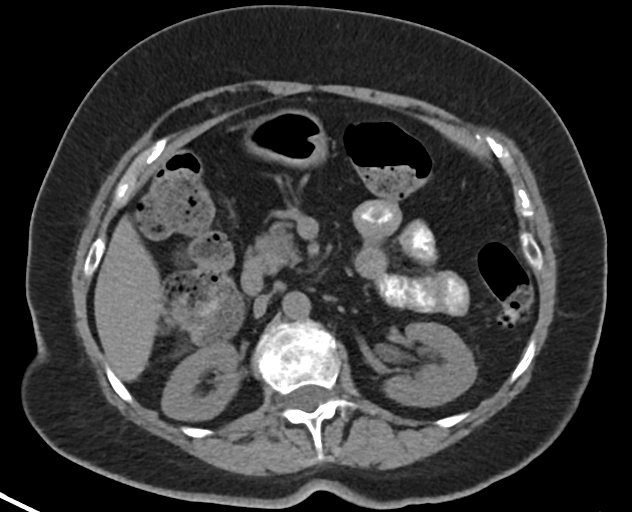
[im 75/88  soft-tissue]
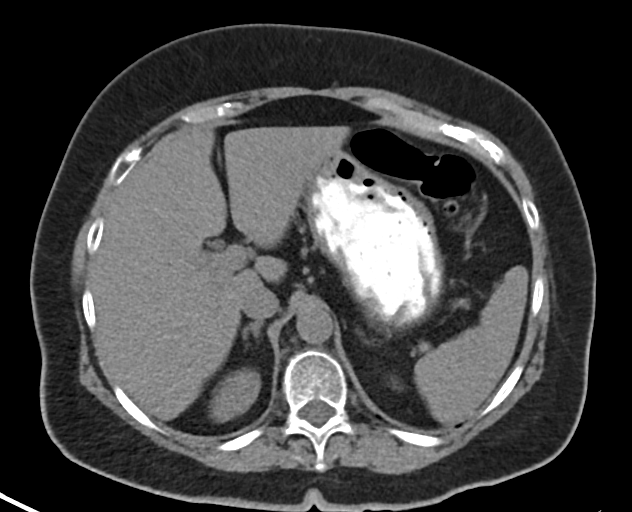
[im 75/88  bone]
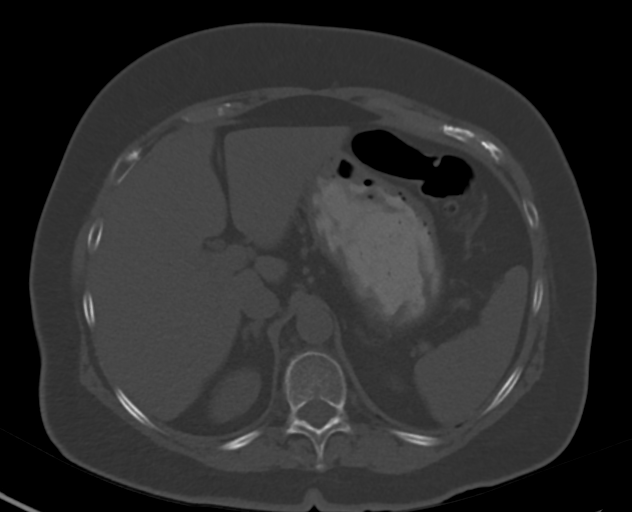
[im 81/88  soft-tissue]
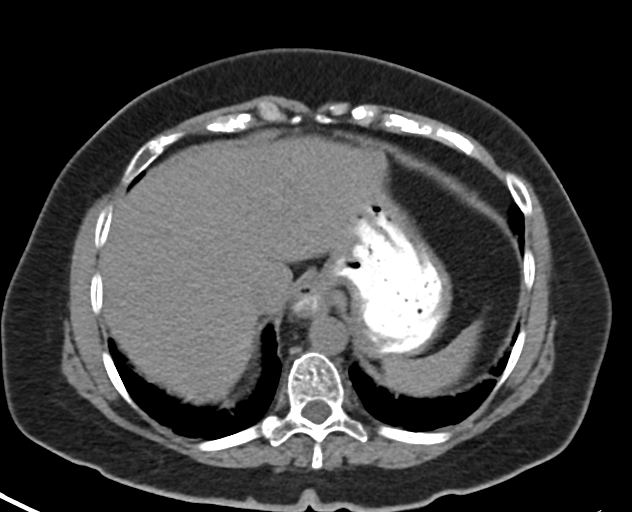

[Series 6: coronal · coronal · 0.71mm/px · 3 of 51 slices shown]
[im 17/51  soft-tissue]
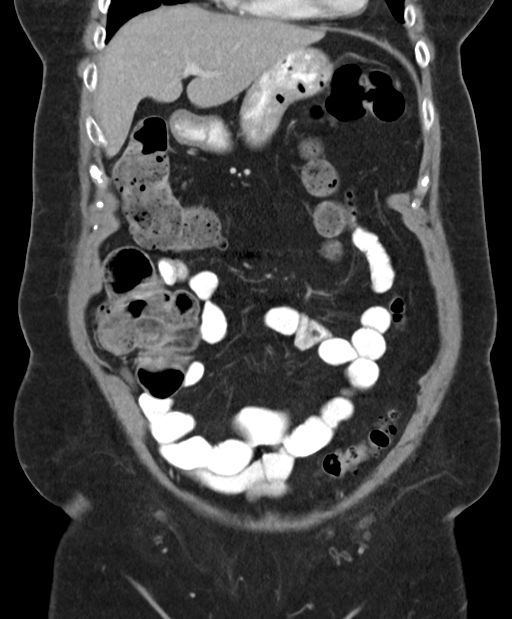
[im 23/51  soft-tissue]
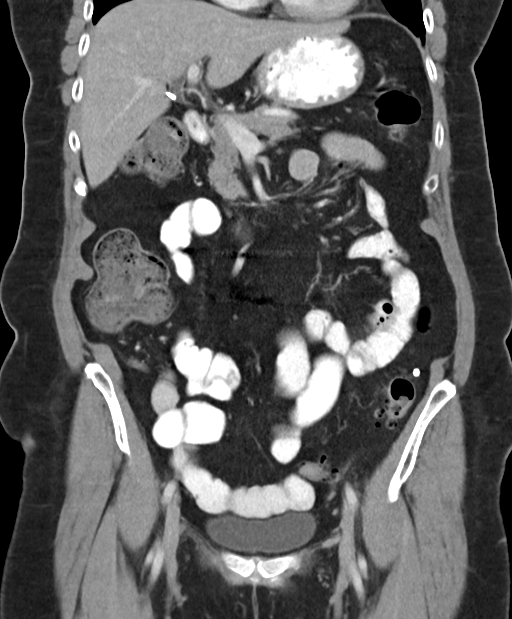
[im 28/51  soft-tissue]
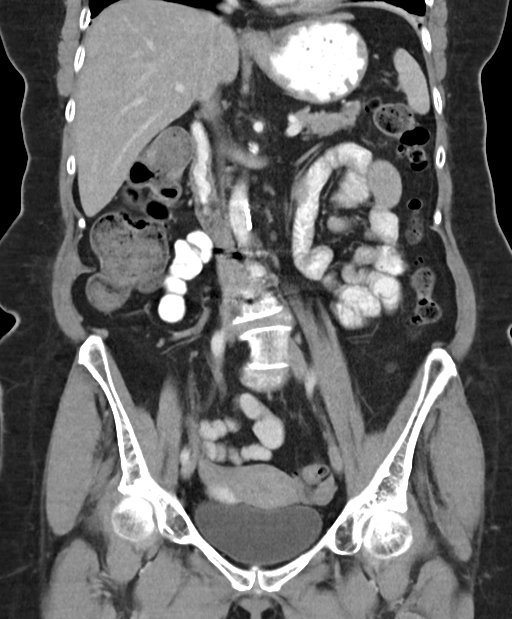

[13 of 46 positions shown; findings below may reference images not displayed]

FINDINGS: CT ABDOMEN: Dependent changes and subsegmental atelectasis of lung bases identified. Interlobular and intralobular interstitial thickening with subpleural bands of lung bases identified. Subsegmental atelectasis of middle lobe also seen.

Heart size is normal. There is no pericardial effusion. Calcified plaques of aorta and its branches seen without aneurysm. Inferior vena cava is normal.

There is prior cholecystectomy. Liver, pancreas, spleen and adrenal glands are normal. Kidneys are normal.

Small hiatal hernia seen. Stomach and is small bowel loops are normal. Appendix is normal. Diverticulosis of descending colon seen without adjacent inflammatory change.

Curvature of thoracolumbar junction region to right with apex at L2 seen. Postsurgical changes of L2-L4 fusion identified. Mild compression superior endplate of T12 with area of lucency identified. Increased subcutaneous adipose tissue seen. Visualized breast tissues are normal. 1 cm umbilical fatty hernia identified. Right posterolateral abdominal wall fatty hernia of 49 x 24 x 52 mm seen.

CT PELVIS: Uterus and ovaries normal. Diverticulosis of sigmoid colon identified without adjacent inflammatory change. There is no perforation or abscess formation.

Urine bladder is normal.

No enlarged pelvic wall or inguinal lymph nodes seen. Bilateral inguinal indirect fatty hernias up to 1.5 cm in diameter seen.

Mild to moderate degenerative changes of SI joints, hip joints and pubic symphysis seen.

Increased subcutaneous adipose tissue seen.

Coronal and sagittal reformation images confirm above findings.
IMPRESSION: Mild compression fracture involving superior endplate of T12 identified as seen on same date lumbar spine x-ray study, possibly due to subacute etiology. Lumbar spine MR study may provide more detailed evaluation.

Right posterolateral abdominal wall fatty hernia up to 49 x 24 x 52 mm again seen.

Umbilical fatty hernia and bilateral inguinal indirect fatty hernias identified.

Diverticulosis of descending colon and sigmoid colon identified without evidence of diverticulitis, perforation or abscess formation.

Additional chronic findings as noted.

No other acute disease process.

Total radiation dose to patient is CTDIvol 18.68 mGy and DLP 877.00 mGy-cm.

## 2021-10-01 IMAGING — MR MRI TSPINE WO CONTRAST
6 series · 48 of 48 positions shown · non-contrast
Comparison: None

HISTORY: Thoracic pain
TECHNIQUE: MRI of the thoracic spine without intravenous contrast.

[Series 5: ii_aaspine_tspine · coronal · 1.7mm · 1.67mm/px · 28 of 160 slices shown]
[im 1/160]
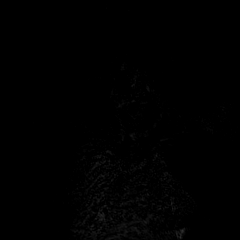
[im 6/160]
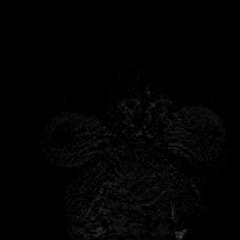
[im 12/160]
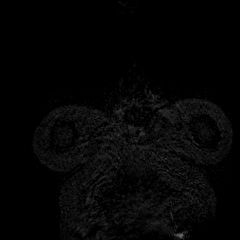
[im 18/160]
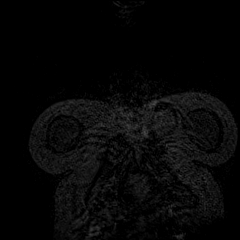
[im 24/160]
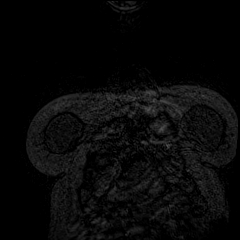
[im 30/160]
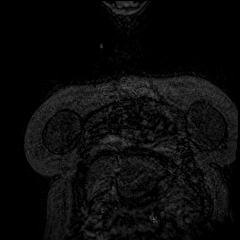
[im 36/160]
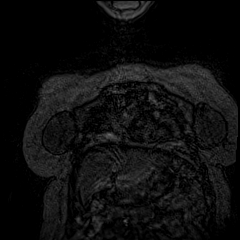
[im 42/160]
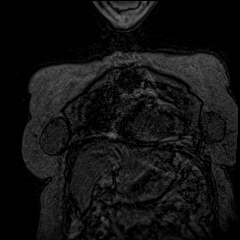
[im 48/160]
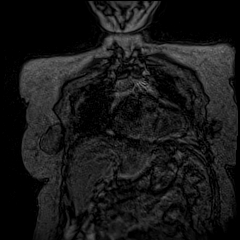
[im 54/160]
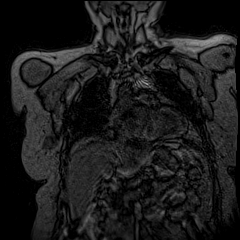
[im 59/160]
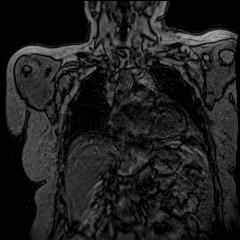
[im 65/160]
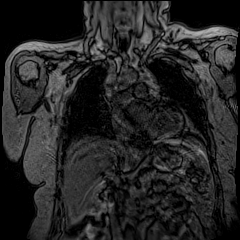
[im 71/160]
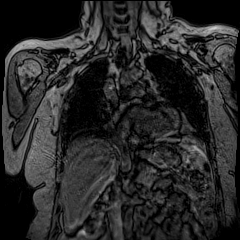
[im 77/160]
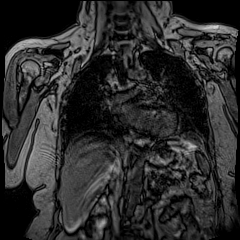
[im 83/160]
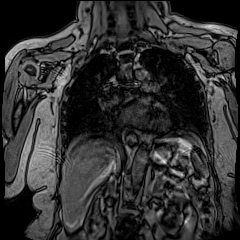
[im 89/160]
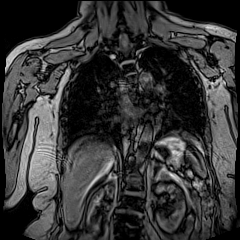
[im 95/160]
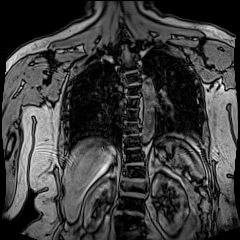
[im 101/160]
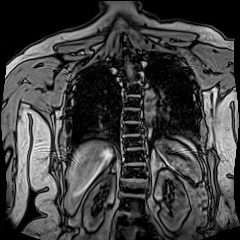
[im 107/160]
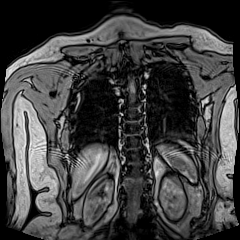
[im 112/160]
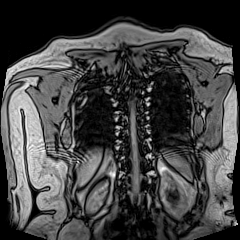
[im 118/160]
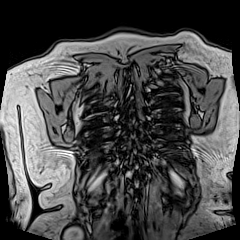
[im 124/160]
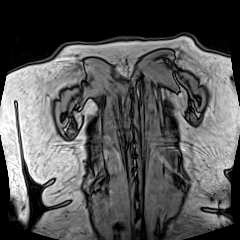
[im 130/160]
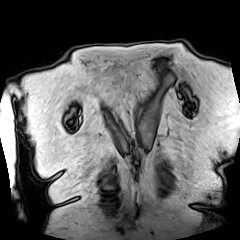
[im 136/160]
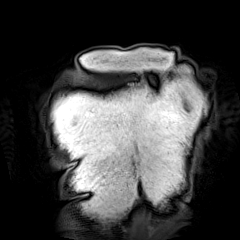
[im 142/160]
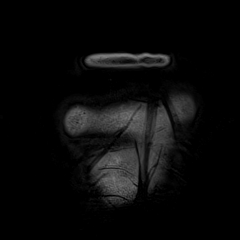
[im 148/160]
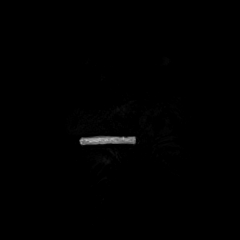
[im 154/160]
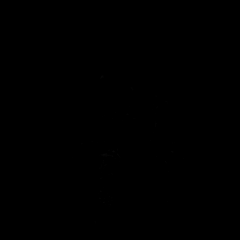
[im 160/160]
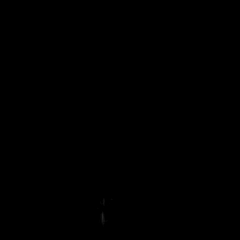

[Series 16: t2_tse_sag · sagittal · 3.0mm · 0.89mm/px · 3 of 21 slices shown]
[im 1/21]
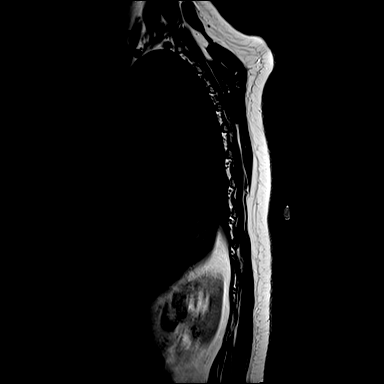
[im 11/21]
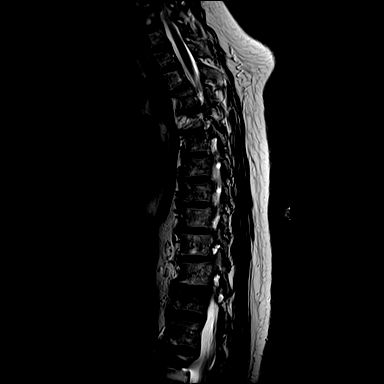
[im 21/21]
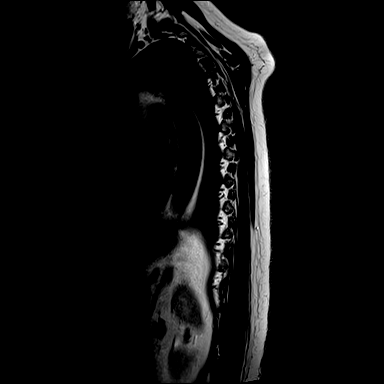

[Series 17: t1_sag · sagittal · 3.0mm · 0.89mm/px · 3 of 21 slices shown]
[im 1/21]
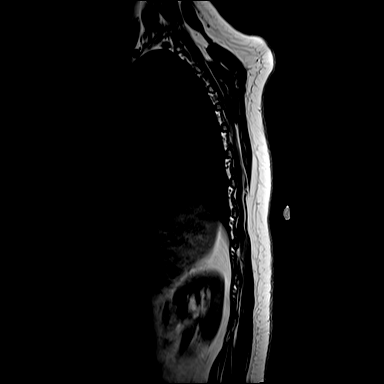
[im 11/21]
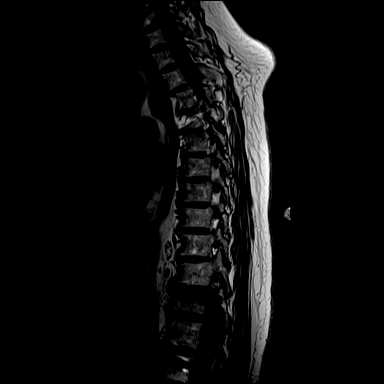
[im 21/21]
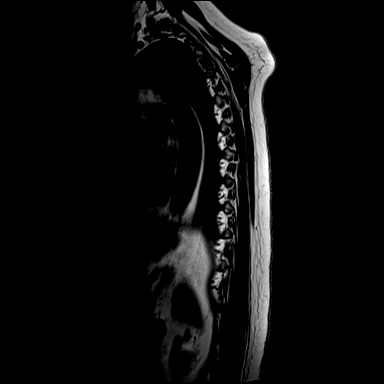

[Series 18: ir_sag · sagittal · 3.0mm · 1.12mm/px · 3 of 21 slices shown]
[im 1/21]
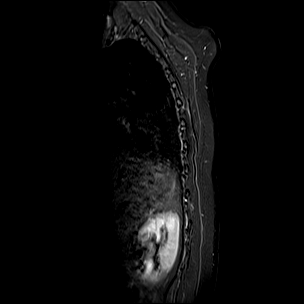
[im 11/21]
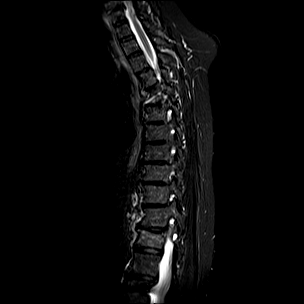
[im 21/21]
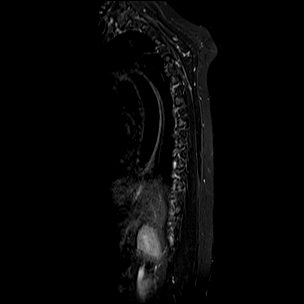

[Series 19: t2_axial · axial · 3.5mm · 0.62mm/px · z∈[-151,-28]mm · 5 of 28 slices shown (1 of 2)]
[im 1/28]
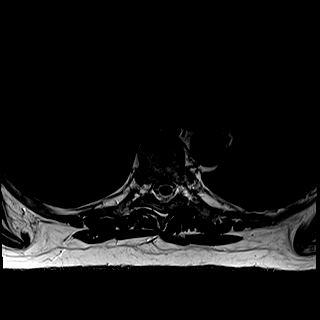
[im 7/28]
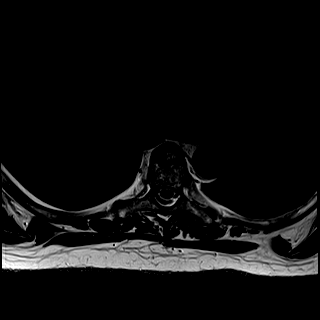
[im 14/28]
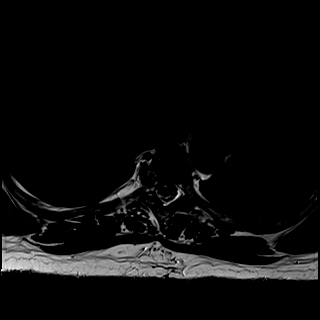
[im 21/28]
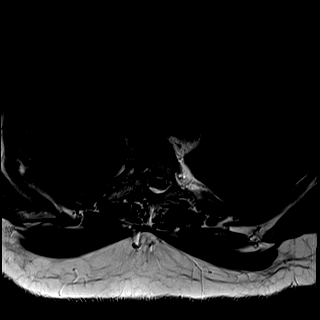
[im 28/28]
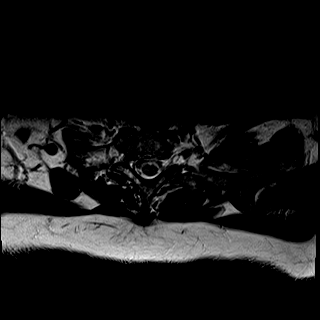

[Series 20: t2_axial · axial · 3.5mm · 0.62mm/px · z∈[-280,-108]mm · 6 of 38 slices shown (2 of 2)]
[im 1/38]
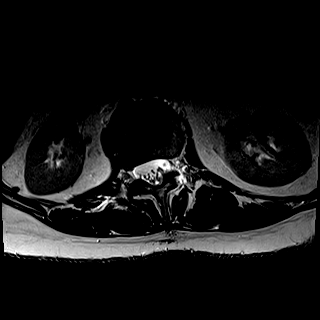
[im 8/38]
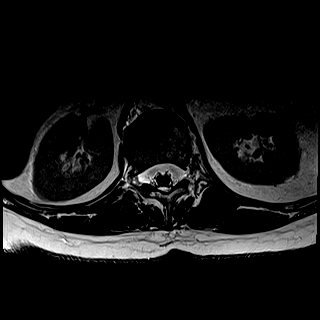
[im 15/38]
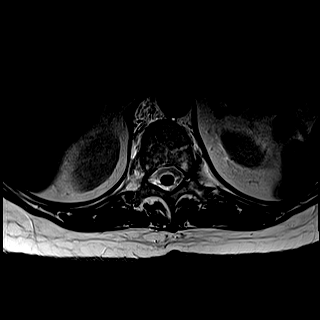
[im 23/38]
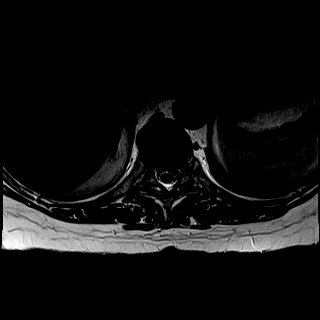
[im 30/38]
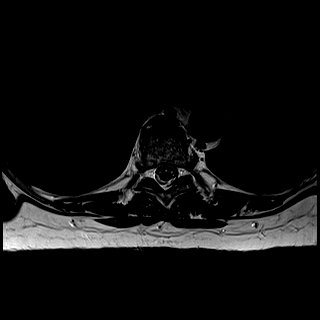
[im 38/38]
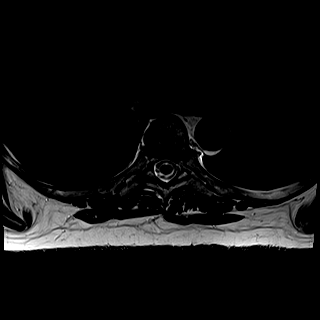

[48 of 48 positions shown; findings below may reference images not displayed]

FINDINGS: Localizer images demonstrate upper thoracic levoscoliosis and lower thoracic dextroscoliosis. No anterolisthesis or retrolisthesis.

There is an acute burst type fracture of T12 vertebral body with approximately 10-15% loss anterior cortical height involving both anterior and posterior cortex. Approximately 3 mm retropulsion fracture fragments into the canal. This produces mild canal stenosis at T11-12. Cord maintains normal signal.

No other acute fractures. No visible destructive lesion.

Multilevel degenerative disc desiccation and loss of disc space height without associated significant disc bulge. No disc herniation.

Multilevel facet arthropathy.

Degenerative factors combine to produce mild multilevel foraminal stenoses. Moderate to advanced right-sided foraminal stenosis at T5-6 largely due to right-sided facet arthropathy.
IMPRESSION: 1.
Acute/subacute burst type fracture of T12 vertebral body with approximately 10-15% loss anterior cortical height involving both anterior and posterior cortex. 3 mm retropulsion of fracture fragments into the canal. This produces mild canal stenosis at T11-12. Cord maintains normal signal at all levels.

2.
Scoliosis with associated multilevel degenerative disc disease and facet arthropathy producing foraminal stenoses as discussed above. Most notably, there is moderate to advanced right-sided foraminal stenosis at T5-6 largely due to right-sided facet arthropathy.

Code fax
